# Patient Record
Sex: Female | Born: 1950 | Race: Black or African American | Hispanic: No | Marital: Married | Smoking: Never smoker
Health system: Southern US, Community
[De-identification: ages and names within clinical notes are randomized; demographics above are authoritative.]

## PROBLEM LIST (undated history)

## (undated) DIAGNOSIS — N289 Disorder of kidney and ureter, unspecified: Secondary | ICD-10-CM

## (undated) DIAGNOSIS — I10 Essential (primary) hypertension: Secondary | ICD-10-CM

## (undated) DIAGNOSIS — I509 Heart failure, unspecified: Secondary | ICD-10-CM

## (undated) DIAGNOSIS — I517 Cardiomegaly: Secondary | ICD-10-CM

## (undated) DIAGNOSIS — C801 Malignant (primary) neoplasm, unspecified: Secondary | ICD-10-CM

## (undated) HISTORY — PX: OTHER SURGICAL HISTORY: SHX169

---

## 2014-10-23 ENCOUNTER — Inpatient Hospital Stay (HOSPITAL_BASED_OUTPATIENT_CLINIC_OR_DEPARTMENT_OTHER)
Admission: EM | Admit: 2014-10-23 | Discharge: 2014-10-27 | DRG: 208 | Disposition: A | Discharge status: Home / Self Care | Payer: BC Managed Care – PPO | Attending: Emergency Medicine | Admitting: Emergency Medicine

## 2014-10-23 ENCOUNTER — Encounter (HOSPITAL_BASED_OUTPATIENT_CLINIC_OR_DEPARTMENT_OTHER): Payer: Self-pay

## 2014-10-23 ENCOUNTER — Emergency Department (HOSPITAL_BASED_OUTPATIENT_CLINIC_OR_DEPARTMENT_OTHER): Payer: BC Managed Care – PPO

## 2014-10-23 DIAGNOSIS — J9601 Acute respiratory failure with hypoxia: Secondary | ICD-10-CM | POA: Diagnosis present

## 2014-10-23 DIAGNOSIS — I120 Hypertensive chronic kidney disease with stage 5 chronic kidney disease or end stage renal disease: Secondary | ICD-10-CM | POA: Diagnosis present

## 2014-10-23 DIAGNOSIS — I509 Heart failure, unspecified: Secondary | ICD-10-CM | POA: Diagnosis present

## 2014-10-23 DIAGNOSIS — R0602 Shortness of breath: Secondary | ICD-10-CM | POA: Diagnosis not present

## 2014-10-23 DIAGNOSIS — J810 Acute pulmonary edema: Principal | ICD-10-CM

## 2014-10-23 DIAGNOSIS — Z923 Personal history of irradiation: Secondary | ICD-10-CM

## 2014-10-23 DIAGNOSIS — D638 Anemia in other chronic diseases classified elsewhere: Secondary | ICD-10-CM | POA: Diagnosis present

## 2014-10-23 DIAGNOSIS — Z9221 Personal history of antineoplastic chemotherapy: Secondary | ICD-10-CM

## 2014-10-23 DIAGNOSIS — N186 End stage renal disease: Secondary | ICD-10-CM | POA: Diagnosis present

## 2014-10-23 DIAGNOSIS — Z992 Dependence on renal dialysis: Secondary | ICD-10-CM

## 2014-10-23 DIAGNOSIS — I248 Other forms of acute ischemic heart disease: Secondary | ICD-10-CM | POA: Diagnosis present

## 2014-10-23 DIAGNOSIS — Z853 Personal history of malignant neoplasm of breast: Secondary | ICD-10-CM

## 2014-10-23 DIAGNOSIS — Z6841 Body Mass Index (BMI) 40.0 and over, adult: Secondary | ICD-10-CM

## 2014-10-23 DIAGNOSIS — E611 Iron deficiency: Secondary | ICD-10-CM | POA: Diagnosis present

## 2014-10-23 DIAGNOSIS — J9600 Acute respiratory failure, unspecified whether with hypoxia or hypercapnia: Secondary | ICD-10-CM

## 2014-10-23 HISTORY — DX: Essential (primary) hypertension: I10

## 2014-10-23 HISTORY — DX: Cardiomegaly: I51.7

## 2014-10-23 HISTORY — DX: Disorder of kidney and ureter, unspecified: N28.9

## 2014-10-23 HISTORY — DX: Heart failure, unspecified: I50.9

## 2014-10-23 HISTORY — DX: Malignant (primary) neoplasm, unspecified: C80.1

## 2014-10-23 LAB — CBC WITH DIFFERENTIAL/PLATELET
BASOS PCT: 0 % (ref 0–1)
Basophils Absolute: 0 10*3/uL (ref 0.0–0.1)
EOS ABS: 1.3 10*3/uL — AB (ref 0.0–0.7)
Eosinophils Relative: 6 % — ABNORMAL HIGH (ref 0–5)
HCT: 39.6 % (ref 36.0–46.0)
HEMOGLOBIN: 12.5 g/dL (ref 12.0–15.0)
Lymphocytes Relative: 7 % — ABNORMAL LOW (ref 12–46)
Lymphs Abs: 1.5 10*3/uL (ref 0.7–4.0)
MCH: 32.1 pg (ref 26.0–34.0)
MCHC: 31.6 g/dL (ref 30.0–36.0)
MCV: 101.8 fL — ABNORMAL HIGH (ref 78.0–100.0)
Monocytes Absolute: 0.7 10*3/uL (ref 0.1–1.0)
Monocytes Relative: 4 % (ref 3–12)
Neutro Abs: 17 10*3/uL — ABNORMAL HIGH (ref 1.7–7.7)
Neutrophils Relative %: 82 % — ABNORMAL HIGH (ref 43–77)
Platelets: 224 10*3/uL (ref 150–400)
RBC: 3.89 MIL/uL (ref 3.87–5.11)
RDW: 17.5 % — ABNORMAL HIGH (ref 11.5–15.5)
WBC: 20.6 10*3/uL — ABNORMAL HIGH (ref 4.0–10.5)

## 2014-10-23 LAB — URINE MICROSCOPIC-ADD ON

## 2014-10-23 LAB — I-STAT ARTERIAL BLOOD GAS, ED
ACID-BASE EXCESS: 3 mmol/L — AB (ref 0.0–2.0)
Acid-base deficit: 1 mmol/L (ref 0.0–2.0)
BICARBONATE: 28.7 meq/L — AB (ref 20.0–24.0)
Bicarbonate: 26.3 mEq/L — ABNORMAL HIGH (ref 20.0–24.0)
O2 SAT: 99 %
O2 Saturation: 97 %
PCO2 ART: 53.8 mmHg — AB (ref 35.0–45.0)
Patient temperature: 98.6
TCO2: 28 mmol/L (ref 0–100)
TCO2: 30 mmol/L (ref 0–100)
pCO2 arterial: 46 mmHg — ABNORMAL HIGH (ref 35.0–45.0)
pH, Arterial: 7.298 — ABNORMAL LOW (ref 7.350–7.450)
pH, Arterial: 7.403 (ref 7.350–7.450)
pO2, Arterial: 102 mmHg — ABNORMAL HIGH (ref 80.0–100.0)
pO2, Arterial: 143 mmHg — ABNORMAL HIGH (ref 80.0–100.0)

## 2014-10-23 LAB — COMPREHENSIVE METABOLIC PANEL
ALK PHOS: 113 U/L (ref 38–126)
ALT: 63 U/L — AB (ref 14–54)
AST: 84 U/L — AB (ref 15–41)
Albumin: 3.9 g/dL (ref 3.5–5.0)
Anion gap: 17 — ABNORMAL HIGH (ref 5–15)
BUN: 32 mg/dL — ABNORMAL HIGH (ref 6–20)
CALCIUM: 9 mg/dL (ref 8.9–10.3)
CO2: 24 mmol/L (ref 22–32)
Chloride: 102 mmol/L (ref 101–111)
Creatinine, Ser: 7.38 mg/dL — ABNORMAL HIGH (ref 0.44–1.00)
GFR calc Af Amer: 6 mL/min — ABNORMAL LOW (ref >60)
GFR, EST NON AFRICAN AMERICAN: 5 mL/min — AB (ref >60)
Glucose, Bld: 180 mg/dL — ABNORMAL HIGH (ref 65–99)
POTASSIUM: 3.8 mmol/L (ref 3.5–5.1)
Sodium: 143 mmol/L (ref 135–145)
Total Bilirubin: 0.6 mg/dL (ref 0.3–1.2)
Total Protein: 6.9 g/dL (ref 6.5–8.1)

## 2014-10-23 LAB — URINALYSIS, ROUTINE W REFLEX MICROSCOPIC
Bilirubin Urine: NEGATIVE
GLUCOSE, UA: 100 mg/dL — AB
Ketones, ur: NEGATIVE mg/dL
Leukocytes, UA: NEGATIVE
Nitrite: NEGATIVE
Protein, ur: >300 mg/dL — AB
Specific Gravity, Urine: 1.01 (ref 1.005–1.030)
Urobilinogen, UA: 0.2 mg/dL (ref 0.0–1.0)
pH: 8 (ref 5.0–8.0)

## 2014-10-23 LAB — PROTIME-INR
INR: 1.03 (ref 0.00–1.49)
Prothrombin Time: 13.7 seconds (ref 11.6–15.2)

## 2014-10-23 LAB — TROPONIN I
TROPONIN I: 0.09 ng/mL — AB (ref <0.031)
Troponin I: 0.1 ng/mL — ABNORMAL HIGH (ref <0.031)

## 2014-10-23 LAB — I-STAT CG4 LACTIC ACID, ED: LACTIC ACID, VENOUS: 2.66 mmol/L — AB (ref 0.5–2.0)

## 2014-10-23 MED ORDER — LIDOCAINE HCL (CARDIAC) 20 MG/ML IV SOLN
INTRAVENOUS | Status: AC
  Filled 2014-10-23: qty 5

## 2014-10-23 MED ORDER — PROPOFOL 10 MG/ML IV BOLUS
0.5000 mg/kg | Freq: Once | INTRAVENOUS | Status: DC
  Administered 2014-10-23: 1000 mg via INTRAVENOUS

## 2014-10-23 MED ORDER — FENTANYL CITRATE (PF) 100 MCG/2ML IJ SOLN: 50.0000 ug | Freq: Once | INTRAMUSCULAR | Status: AC

## 2014-10-23 MED ORDER — FENTANYL CITRATE (PF) 100 MCG/2ML IJ SOLN
INTRAMUSCULAR | Status: AC
  Administered 2014-10-23: 50 ug
  Filled 2014-10-23: qty 2

## 2014-10-23 MED ORDER — ETOMIDATE 2 MG/ML IV SOLN
INTRAVENOUS | Status: AC | PRN
  Administered 2014-10-23: 20 mg via INTRAVENOUS

## 2014-10-23 MED ORDER — ASPIRIN 81 MG PO CHEW: 324.0000 mg | CHEWABLE_TABLET | Freq: Once | ORAL | Status: DC

## 2014-10-23 MED ORDER — PROPOFOL 1000 MG/100ML IV EMUL
INTRAVENOUS | Status: AC | PRN
  Administered 2014-10-23: 5 ug/kg/min via INTRAVENOUS

## 2014-10-23 MED ORDER — SODIUM CHLORIDE 0.9 % IV SOLN
20.0000 ug/h | INTRAVENOUS | Status: DC
  Filled 2014-10-23: qty 50

## 2014-10-23 MED ORDER — NITROGLYCERIN IN D5W 200-5 MCG/ML-% IV SOLN
0.0000 ug/min | Freq: Once | INTRAVENOUS | Status: AC
  Administered 2014-10-23: 10 ug/min via INTRAVENOUS

## 2014-10-23 MED ORDER — ONDANSETRON HCL 4 MG/2ML IJ SOLN
4.0000 mg | Freq: Once | INTRAMUSCULAR | Status: AC
  Administered 2014-10-23: 4 mg via INTRAVENOUS

## 2014-10-23 MED ORDER — ONDANSETRON HCL 4 MG/2ML IJ SOLN
INTRAMUSCULAR | Status: AC
  Filled 2014-10-23: qty 2

## 2014-10-23 MED ORDER — VANCOMYCIN HCL IN DEXTROSE 1-5 GM/200ML-% IV SOLN
1000.0000 mg | Freq: Once | INTRAVENOUS | Status: AC
Start: 2014-10-23 — End: 2014-10-23
  Administered 2014-10-23: 1000 mg via INTRAVENOUS
  Filled 2014-10-23: qty 200

## 2014-10-23 MED ORDER — ETOMIDATE 2 MG/ML IV SOLN
INTRAVENOUS | Status: AC
  Filled 2014-10-23: qty 20

## 2014-10-23 MED ORDER — ROCURONIUM BROMIDE 50 MG/5ML IV SOLN
INTRAVENOUS | Status: AC | PRN
  Administered 2014-10-23: 100 mg via INTRAVENOUS

## 2014-10-23 MED ORDER — FUROSEMIDE 10 MG/ML IJ SOLN
80.0000 mg | Freq: Once | INTRAMUSCULAR | Status: AC
  Administered 2014-10-23: 80 mg via INTRAVENOUS
  Filled 2014-10-23: qty 8

## 2014-10-23 MED ORDER — SUCCINYLCHOLINE CHLORIDE 20 MG/ML IJ SOLN
INTRAMUSCULAR | Status: AC
  Filled 2014-10-23: qty 1

## 2014-10-23 MED ORDER — NITROGLYCERIN IN D5W 200-5 MCG/ML-% IV SOLN
INTRAVENOUS | Status: AC
  Filled 2014-10-23: qty 250

## 2014-10-23 MED ORDER — PROPOFOL 1000 MG/100ML IV EMUL
INTRAVENOUS | Status: AC
  Filled 2014-10-23: qty 100

## 2014-10-23 MED ORDER — ROCURONIUM BROMIDE 50 MG/5ML IV SOLN
INTRAVENOUS | Status: AC
  Filled 2014-10-23: qty 2

## 2014-10-23 MED ORDER — FENTANYL CITRATE (PF) 100 MCG/2ML IJ SOLN
50.0000 ug | Freq: Once | INTRAMUSCULAR | Status: AC
  Administered 2014-10-23: 50 ug via INTRAVENOUS
  Filled 2014-10-23: qty 2

## 2014-10-23 MED ORDER — PIPERACILLIN-TAZOBACTAM 3.375 G IVPB
3.3750 g | Freq: Once | INTRAVENOUS | Status: AC
  Administered 2014-10-23: 3.375 g via INTRAVENOUS
  Filled 2014-10-23: qty 50

## 2014-10-23 MED ORDER — FUROSEMIDE 10 MG/ML IJ SOLN
INTRAMUSCULAR | Status: AC | PRN
  Administered 2014-10-23: 80 mg via INTRAVENOUS

## 2014-10-23 MED ORDER — FUROSEMIDE 10 MG/ML IJ SOLN
INTRAMUSCULAR | Status: AC
  Filled 2014-10-23: qty 8

## 2014-10-23 MED ORDER — PROPOFOL 1000 MG/100ML IV EMUL
5.0000 ug/kg/min | Freq: Once | INTRAVENOUS | Status: AC
Start: 2014-10-23 — End: 2014-10-23
  Administered 2014-10-23: 40 ug/kg/min via INTRAVENOUS

## 2014-10-23 MED ORDER — LABETALOL HCL 5 MG/ML IV SOLN: 20.0000 mg | Freq: Once | INTRAVENOUS | Status: DC

## 2014-10-23 NOTE — H&P (Addendum)
PULMONARY / CRITICAL CARE MEDICINE   Name: Courtney Bullock MRN: [id number] DOB: [birth year]    ADMISSION DATE:  10/23/2014  CHIEF COMPLAINT:  Shortness of breath  HISTORY OF PRESENT ILLNESS:  Patient is a 64 year old female with a past medical history significant for end-stage renal disease on hemodialysis (she does make urine, though), CHF unknown type, breast cancer cancer. Per report, the patient's husband at the outside ED states that the patient has not missed any dialysis appointments. Her last session was 1 day ago.  Per report, patient was picked up by EMS where she was wearing her home CPAP and was placed on BiPAP at the emergency department but then was noted to have a decreased level of consciousness and then became nonverbal. She began to arouse after about 10 minutes at the emergency department but then became nauseous which led to her being taken off of BiPAP. Her work of breathing increased and the decision was made to intubate the patient. It is noted in the respiratory therapist note that she had a moderate amount of pink, thick, frothy secretions which were suctioned from the endotracheal tube.  Upon arrival to our ICU the patient's husband and daughter were at bedside and they agreed with the history provided from the other facility stating that she was in her normal state of health and they were getting ready to go to dinner when the patient had an acute onset of shortness of breath upon standing from the couch. Family states that she was at her baseline state of health prior to this  PAST MEDICAL HISTORY :   has a past medical history of Renal disorder; Hypertension; Cancer; CHF (congestive heart failure); and Cardiomegaly.  has past surgical history that includes uta. Prior to Admission medications   Not on File   Allergies not on file  FAMILY HISTORY:  has no family status information on file.  SOCIAL HISTORY:    REVIEW OF SYSTEMS:  Unable to obtain as the patient is  currently sedated on propofol and intubated   VITAL SIGNS: Temp:  [97.9 F (36.6 C)-98.2 F (36.8 C)] 98.1 F (36.7 C) (06/18 2315) Pulse Rate:  [81-131] 81 (06/18 2315) Resp:  [0-41] 32 (06/18 2315) BP: (98-262)/(63-147) 98/63 mmHg (06/18 2315) SpO2:  [85 %-100 %] 100 % (06/18 2315) FiO2 (%):  [80 %-100 %] 80 % (06/18 2144) Weight:  [242 lb 8.1 oz (110 kg)] 242 lb 8.1 oz (110 kg) (06/18 2050) HEMODYNAMICS:   VENTILATOR SETTINGS: Vent Mode:  [-] PRVC FiO2 (%):  [80 %-100 %] 80 % Set Rate:  [15 bmp-32 bmp] 32 bmp Vt Set:  [400 mL-550 mL] 400 mL PEEP:  [5 cmH20-10 cmH20] 10 cmH20 Plateau Pressure:  [28 cmH20] 28 cmH20 INTAKE / OUTPUT:  Intake/Output Summary (Last 24 hours) at 10/23/14 2358 Last data filed at 10/23/14 2215  Gross per 24 hour  Intake 298.06 ml  Output     50 ml  Net 248.06 ml    PHYSICAL EXAMINATION: General:  64 year old African-American female who appears her appear stated age in no acute distress. She is currently sedated and intubated. She appears well-nourished and well-developed Neuro:  Cranial nerves II-XII grossly intact. PERRL. her pupils are 2 mm. EOMI was not assessed. Sensation and strength was not assessed.  HEENT: Head, normocephalic, atraumatic. Ears symmetric, permeable. Eyes: no conjunctival icterus, no erythema. Nose: permeable, midline septum no NGT present. Clear oropharynx ETT in place, fair dentition. Cardiovascular: S1S2 RRR no murmurs, rubs, or gallops  auscultated. No thrills palpated. She has a dialysis catheter in her right upper chest and the scar of her port in her left upper chest. She does have 1+ pitting edema in bilateral lower extremities. Lungs:  Chest symmetrical with respirations, clear to auscultation bilaterally with no wheezing, some crackles, equal expansion.  Abdomen:  Soft, nontender, no guarding, nondistended. Present bowel sounds. Musculoskeletal:  No muscle atrophy noted nor weakness. ROM was not assessed. Gait was not  assessed due to critical illness. No swelling nor tenderness of the joints.  Skin:  No notable scars, rashes, cruises. No bed sores noted.  Lymphatics: no palpable lymphadenopathy of cervical, supraclvicular nor inguinal areas.    LABS:  CBC  Recent Labs Lab 10/23/14 2000 10/24/14 0225  WBC 20.6* 10.8*  HGB 12.5 11.2*  HCT 39.6 35.3*  PLT 224 192   Coag's  Recent Labs Lab 10/23/14 2000  INR 1.03   BMET  Recent Labs Lab 10/23/14 2000 10/24/14 0225  NA 143 142  K 3.8 4.8  CL 102 104  CO2 24 25  BUN 32* 31*  CREATININE 7.38* 7.94*  GLUCOSE 180* 87   Electrolytes  Recent Labs Lab 10/23/14 2000 10/24/14 0225  CALCIUM 9.0 9.2  MG  --  2.3  PHOS  --  7.1*   Sepsis Markers  Recent Labs Lab 10/23/14 2103  LATICACIDVEN 2.66*   ABG  Recent Labs Lab 10/23/14 2027 10/23/14 2142 10/24/14 0145  PHART 7.298* 7.403 7.480*  PCO2ART 53.8* 46.0* 33.4*  PO2ART 102.0* 143.0* 218*   Liver Enzymes  Recent Labs Lab 10/23/14 2000 10/24/14 0225  AST 84* 52*  ALT 63* 48  ALKPHOS 113 87  BILITOT 0.6 0.7  ALBUMIN 3.9 3.2*   Cardiac Enzymes  Recent Labs Lab 10/23/14 2000 10/23/14 2210 10/24/14 0225  TROPONINI 0.09* 0.10* 0.12*   Glucose No results for input(s): GLUCAP in the last 168 hours.  Imaging Dg Chest Portable 1 View  10/23/2014   CLINICAL DATA:  Hypoxia.  Chronic renal failure  EXAM: PORTABLE CHEST - 1 VIEW  COMPARISON:  None.  FINDINGS: Endotracheal tube tip is 4.2 cm above the carina. Central catheter tip is in the superior vena cava. No pneumothorax. There is extensive interstitial and alveolar edema diffusely. Heart is enlarged with pulmonary venous hypertension. No apparent adenopathy.  IMPRESSION: Tube and catheter positions as described without pneumothorax. Evidence of congestive heart failure with extensive edema as well as cardiomegaly.   Electronically Signed   By: Lowella Grip III M.D.   On: 10/23/2014 20:29     ASSESSMENT /  PLAN:  PULMONARY OETT placed on 10/23/2014 A: Acute hypoxemic respiratory failure secondary to pulmonary edema - Pink frothy secretions noted by RT at outside hospital P:  Vent management, will wean as tolerated but ultimately the patient will likely improve on the ventilator after hemodialysis in the morning  CARDIOVASCULAR A: Hypertensive emergency with end organ damage being flash pulmonary edema History of heart failure unknown type, acute exacerbation of this Elevated troponin of 0.12, likely secondary to hypertensive emergency P: check a 2-D echo, trend troponins, check EKG, obtain home medication regimen, attempt to wean nitroglycerin drip, start hydralazine IV as needed, will continue to try to dietaries  RENAL A:  End-stage renal disease on hemodialysis Tuesday Thursday Saturday, patient does not have any electrolyte abnormalities warranting emergent dialysis at this time -UA with significant proteinuria P:  consult nephrology in the morning for hemodialysis  GASTROINTESTINAL A:  Slight transaminitis with increased AST  of 52 possibly secondary to congestive hepatopathy, improved P:  GI prophylaxis with once a day famotidine due to her renal dysfunction  HEMATOLOGIC/oncologic A:  History of breast cancer now in remission for the last 2 years Normocytic anemia, likely of chronic disease P:   INFECTIOUS A:  Patient does not appear to have a source of infection at this time. She did receive broad-spectrum antibiotics empirically in the emergency department of outside hospital. The leukocytosis noted at outside hospitals now resolved P:  Follow up blood cultures taken at outside hospital  ENDOCRINE A:  No active issues   P:     NEUROLOGIC A:  No active issues, currently sedated on propofol P:  RASS goal: -1 to 0   FAMILY  - Updates: Daughter and husband at bedside. I answered all her questions and explained the differential.  - Inter-disciplinary family meet or  Palliative Care meeting due by:  day 7  Critical care time 76 minutes  Randa Lynn, MD [place] Pager: [phone]  10/24/2014, 4:15 AM

## 2014-10-23 NOTE — ED Notes (Signed)
Report given to Janalee Dane RN at bedside.

## 2014-10-23 NOTE — Code Documentation (Signed)
Family at bedside, EDP at bedside, family updated and made aware of patients condition and plan by EDP.

## 2014-10-23 NOTE — ED Notes (Signed)
Report given to [facility]. 

## 2014-10-23 NOTE — ED Provider Notes (Signed)
CSN: [id number]     Arrival date & time 10/23/14  1953 History   This chart was scribed for Courtney Essex, MD by Meriel Pica, ED Scribe. This patient was seen in room MHT14/MHT14 and the patient's care was started 7:56 PM.  Chief Complaint  Patient presents with  . Respiratory Distress   LEVEL 5 CAVEAT: ACUITY OF PATIENT'S CONDITION   The history is provided by the patient, the spouse and the EMS personnel.    HPI Comments: Courtney Bullock is a 64 y.o. female, with a PMhx of renal failure, cancer, and cardiac pathologies, who presents to the Emergency Department via EMS complaining of progressively worsening SOB. Pt on EMS BiPAP with decreased LOC. Non verbal on arrival. Pulses present but weak. Per EMS she was initially verbal on transport, she is now non verbal with AMS. Pt is out of town, residing in [place], and visiting at her daughter's residence. She is currently undergoing dialysis treatment. Husband  states she has not missed any dialysis appointments, with her last treatment being 1 day ago. Husband reports pt has a history of cancer that is in remission and cardiac pathologies but he denies cardiac stents. He also notes she has experienced  CP before but never this severe. He denies her complaining of CP today. Pt is now verbalizing SOB but states the O2 mask is helping her to breathe. She denies chest pain and abdominal pain. Husband states she produces a normal amount of urine and denies asthma, COPD, tobacco abuse, anuria, or pr taking any blood thinning medication or diuretics.   Past Medical History  Diagnosis Date  . Renal disorder   . Hypertension   . Cancer   . CHF (congestive heart failure)   . Cardiomegaly    Past Surgical History  Procedure Laterality Date  . Uta      pt intubated upon ed arrival   History reviewed. No pertinent family history. History  Substance Use Topics  . Smoking status: Unknown If Ever Smoked  . Smokeless tobacco: Not on file  .  Alcohol Use: Not on file   OB History    No data available     Review of Systems  Unable to perform ROS: Acuity of condition  Respiratory: Positive for shortness of breath.   Cardiovascular: Negative for chest pain.  Gastrointestinal: Negative for abdominal pain.   Allergies  Sulfamethoxazole and Trimethoprim  Home Medications   Prior to Admission medications   Not on File   BP 98/63 mmHg  Pulse 81  Temp(Src) 98.1 F (36.7 C)  Resp 32  Ht 5\' 2"  (1.575 m)  Wt 242 lb 8.1 oz (110 kg)  BMI 44.34 kg/m2  SpO2 100%   Physical Exam  Constitutional: She appears well-developed and well-nourished. She appears distressed.  +1 pitting edema bilaterally.   HENT:  Head: Normocephalic and atraumatic.  Mouth/Throat: Oropharynx is clear and moist. No oropharyngeal exudate.  Eyes: Conjunctivae and EOM are normal. Pupils are equal, round, and reactive to light.  Neck: Normal range of motion. Neck supple.  No meningismus.  Cardiovascular: Regular rhythm, normal heart sounds and intact distal pulses.   No murmur heard. Tachycardic, hypertensive.   Pulmonary/Chest: She is in respiratory distress. She has no wheezes. She has rales.  Obtunded, tachypnic, diffuse rales on lung exam.  Dialysis catheter in right upper chest.  Abdominal: Soft. Bowel sounds are normal. There is no tenderness. There is no rebound and no guarding.  Musculoskeletal: Normal range of motion. She  exhibits edema. She exhibits no tenderness.  Neurological: She is alert. No cranial nerve deficit. She exhibits normal muscle tone. Coordination normal.  Obtunded, not initially following commands.  Answering questions appropriately and moving all extremities to command after bipap for several minutes.  Skin: Skin is warm. She is diaphoretic.  Psychiatric: She has a normal mood and affect. Her behavior is normal.  Nursing note and vitals reviewed.   ED Course  INTUBATION Date/Time: 10/23/2014 9:34 PM Performed by:  Courtney Bullock Authorized by: Courtney Bullock Consent: The procedure was performed in an emergent situation. Verbal consent obtained. Risks and benefits: risks, benefits and alternatives were discussed Consent given by: spouse and patient Patient understanding: patient states understanding of the procedure being performed Patient identity confirmed: provided demographic data Time out: Immediately prior to procedure a "time out" was called to verify the correct patient, procedure, equipment, support staff and site/side marked as required. Indications: respiratory distress,  respiratory failure and  airway protection Intubation method: video-assisted Patient status: paralyzed (RSI) Preoxygenation: nonrebreather mask Sedatives: etomidate Paralytic: rocuronium Laryngoscope size: Mac 3 Tube size: 7.5 mm Tube type: cuffed Number of attempts: 1 Ventilation between attempts: BVM Cricoid pressure: yes Cords visualized: yes Post-procedure assessment: chest rise and ETCO2 monitor Breath sounds: equal Cuff inflated: yes ETT to lip: 22 cm Tube secured with: ETT holder Chest x-ray interpreted by me and radiologist. Chest x-ray findings: endotracheal tube in appropriate position Patient tolerance: Patient tolerated the procedure well with no immediate complications    DIAGNOSTIC STUDIES: Oxygen Saturation is 100% on O2 mask, normal by my interpretation.    COORDINATION OF CARE: 8:06 PM Discussed treatment plan which includes to order lasix and NTG drip.  Husband acknowledges and agrees to plan.  8:07 PM Pt experiencing an episode of emesis. Will perform intubation. Husband acknowledges and agrees to plan. Critical care transport called to [facility] ED.  Labs Review Labs Reviewed  CBC WITH DIFFERENTIAL/PLATELET - Abnormal; Notable for the following:    WBC 20.6 (*)    MCV 101.8 (*)    RDW 17.5 (*)    Neutrophils Relative % 82 (*)    Neutro Abs 17.0 (*)     Lymphocytes Relative 7 (*)    Eosinophils Relative 6 (*)    Eosinophils Absolute 1.3 (*)    All other components within normal limits  COMPREHENSIVE METABOLIC PANEL - Abnormal; Notable for the following:    Glucose, Bld 180 (*)    BUN 32 (*)    Creatinine, Ser 7.38 (*)    AST 84 (*)    ALT 63 (*)    GFR calc non Af Amer 5 (*)    GFR calc Af Amer 6 (*)    Anion gap 17 (*)    All other components within normal limits  TROPONIN I - Abnormal; Notable for the following:    Troponin I 0.09 (*)    All other components within normal limits  URINALYSIS, ROUTINE W REFLEX MICROSCOPIC (NOT AT [facility]) - Abnormal; Notable for the following:    Glucose, UA 100 (*)    Hgb urine dipstick MODERATE (*)    Protein, ur >300 (*)    All other components within normal limits  TROPONIN I - Abnormal; Notable for the following:    Troponin I 0.10 (*)    All other components within normal limits  I-STAT CG4 LACTIC ACID, ED - Abnormal; Notable for the following:    Lactic Acid, Venous 2.66 (*)  All other components within normal limits  I-STAT ARTERIAL BLOOD GAS, ED - Abnormal; Notable for the following:    pH, Arterial 7.298 (*)    pCO2 arterial 53.8 (*)    pO2, Arterial 102.0 (*)    Bicarbonate 26.3 (*)    All other components within normal limits  I-STAT ARTERIAL BLOOD GAS, ED - Abnormal; Notable for the following:    pCO2 arterial 46.0 (*)    pO2, Arterial 143.0 (*)    Bicarbonate 28.7 (*)    Acid-Base Excess 3.0 (*)    All other components within normal limits  CULTURE, BLOOD (ROUTINE X 2)  CULTURE, BLOOD (ROUTINE X 2)  MRSA PCR SCREENING  PROTIME-INR  URINE MICROSCOPIC-ADD ON  BLOOD GAS, ARTERIAL  MAGNESIUM  PHOSPHORUS  PROCALCITONIN  CBC WITH DIFFERENTIAL/PLATELET  COMPREHENSIVE METABOLIC PANEL  TROPONIN I  TROPONIN I  TROPONIN I  BLOOD GAS, ARTERIAL  BLOOD GAS, ARTERIAL  TRIGLYCERIDES  I-STAT CG4 LACTIC ACID, ED    Imaging Review Dg Chest Portable 1 View  10/23/2014    CLINICAL DATA:  Hypoxia.  Chronic renal failure  EXAM: PORTABLE CHEST - 1 VIEW  COMPARISON:  None.  FINDINGS: Endotracheal tube tip is 4.2 cm above the carina. Central catheter tip is in the superior vena cava. No pneumothorax. There is extensive interstitial and alveolar edema diffusely. Heart is enlarged with pulmonary venous hypertension. No apparent adenopathy.  IMPRESSION: Tube and catheter positions as described without pneumothorax. Evidence of congestive heart failure with extensive edema as well as cardiomegaly.   Electronically Signed   By: Lowella Grip III M.D.   On: 10/23/2014 20:29     EKG Interpretation   Date/Time:  Saturday October 23 2014 21:34:00 EDT Ventricular Rate:  108 PR Interval:  140 QRS Duration: 86 QT Interval:  390 QTC Calculation: 522 R Axis:   10 Text Interpretation:  Sinus tachycardia with Premature atrial complexes  Possible Left atrial enlargement Left ventricular hypertrophy Cannot rule  out Septal infarct , age undetermined ST \\T \ T wave abnormality, consider  lateral ischemia Prolonged QT Abnormal ECG No significant change was found  Confirmed by Wyvonnia Dusky  MD, Mairany Bruno [phone]) on 10/23/2014 9:40:46 PM      MDM   Final diagnoses:  Acute respiratory failure with hypoxia  Acute pulmonary edema   Presents by EMS with respiratory distress, hypoxia, tachycardia and hypertensive. Dialysis patient with last treatment yesterday. Visiting from out of town. Patient denies any chest pain according to her husband. She is not missed any dialysis sessions.  On arrival patient is obtunded, hypoxic, tachypnea. She is placed on BiPAP with improvement in her mental status. She denies any pain. She feels like her breathing is better though she still to And feels like she can't get enough air. Concern for volume overload. She is given IV Lasix as she reportedly makes urine, IV nitroglycerin, chest x-ray, labs, ABG.  EKG shows T-wave inversions inferior laterally. No  comparison.  Patient began to feel nauseated and had episode of emesis in her mouth that was quickly suctioned. At this point decision was made to intubate patient for airway protection.  Patient intubated as above. Chest x-ray confirms pulmonary edema. Discussed with critical care Dr. Joya Gaskins and nephrology who will admit patient to [facility] Patient's potassium is within normal limits. Dr. Jonnie Finner of nephrology has concern for cardiac etiology of her pulmonary edema given its acute onset.  Her troponin is minimally elevated.  BP improving on NTG gtt. Afebrile but does  have leukocytosis and elevated lactate.  Will cover with antibiotics for any possible superimposed pneumonia.  Admission to [place] dw. Dr. Joya Gaskins.  Dr. Jonnie Finner to consult for possible need for emergent dialysis and hypertensive emergency.   CRITICAL CARE Performed by: Courtney Bullock Total critical care time: 60 Critical care time was exclusive of separately billable procedures and treating other patients. Critical care was necessary to treat or prevent imminent or life-threatening deterioration. Critical care was time spent personally by me on the following activities: development of treatment plan with patient and/or surrogate as well as nursing, discussions with consultants, evaluation of patient's response to treatment, examination of patient, obtaining history from patient or surrogate, ordering and performing treatments and interventions, ordering and review of laboratory studies, ordering and review of radiographic studies, pulse oximetry and re-evaluation of patient's condition.    Courtney Essex, MD 10/24/14 [phone]

## 2014-10-23 NOTE — Progress Notes (Signed)
Received pt from EMS. Pt was short of breath and wearing home CPAP on their arrival. Pt placed on EMS CPAP with sats in the 70%. Placed on BIPAP/PC on arrival with settings of 16/6 R15 100%. Pt was initially unresponsive but began to arouse after 10 minutes. She then became nauseous and was taken off BIPAP. Pt unable to maintain sats with increased WOB. We proceeded to intubate the pt with 7.5 ETT secured with BBS and color change. Initial vent settings were 550/20/+5/100%. First ABG was drawn post intubation on these vent settings. Pt then began to desat and vent settings were changed to 400/32/+10/100%. Sats improved and moderate amount of thick, pink, frothy secretions were suctioned from the ETT. The pt was measured at 5'2" and 400cc is her 8cc VT. Repeat ABG was drawn after these vent changes and O2 has been titrated appropriately. RT will continue to monitor.

## 2014-10-23 NOTE — ED Notes (Signed)
Report given to [employer]. At [facility] .

## 2014-10-23 NOTE — ED Notes (Signed)
Pt presents via [facility] with c/o respiratory distress - pt on EMS bipap with decreased LOC, non verbal on arrival, pulses present but weak at this time. EDP to bedside.

## 2014-10-24 ENCOUNTER — Encounter (HOSPITAL_COMMUNITY): Payer: Self-pay | Admitting: Certified Registered Nurse Anesthetist

## 2014-10-24 ENCOUNTER — Inpatient Hospital Stay (HOSPITAL_COMMUNITY): Payer: BC Managed Care – PPO

## 2014-10-24 DIAGNOSIS — J9601 Acute respiratory failure with hypoxia: Secondary | ICD-10-CM | POA: Diagnosis present

## 2014-10-24 DIAGNOSIS — Z853 Personal history of malignant neoplasm of breast: Secondary | ICD-10-CM | POA: Diagnosis not present

## 2014-10-24 DIAGNOSIS — I120 Hypertensive chronic kidney disease with stage 5 chronic kidney disease or end stage renal disease: Secondary | ICD-10-CM | POA: Diagnosis present

## 2014-10-24 DIAGNOSIS — D638 Anemia in other chronic diseases classified elsewhere: Secondary | ICD-10-CM | POA: Diagnosis present

## 2014-10-24 DIAGNOSIS — I10 Essential (primary) hypertension: Secondary | ICD-10-CM | POA: Diagnosis not present

## 2014-10-24 DIAGNOSIS — I509 Heart failure, unspecified: Secondary | ICD-10-CM | POA: Diagnosis not present

## 2014-10-24 DIAGNOSIS — R0602 Shortness of breath: Secondary | ICD-10-CM | POA: Diagnosis present

## 2014-10-24 DIAGNOSIS — Z9221 Personal history of antineoplastic chemotherapy: Secondary | ICD-10-CM | POA: Diagnosis not present

## 2014-10-24 DIAGNOSIS — J810 Acute pulmonary edema: Secondary | ICD-10-CM | POA: Diagnosis present

## 2014-10-24 DIAGNOSIS — Z923 Personal history of irradiation: Secondary | ICD-10-CM | POA: Diagnosis not present

## 2014-10-24 DIAGNOSIS — E611 Iron deficiency: Secondary | ICD-10-CM | POA: Diagnosis present

## 2014-10-24 DIAGNOSIS — I248 Other forms of acute ischemic heart disease: Secondary | ICD-10-CM | POA: Diagnosis present

## 2014-10-24 DIAGNOSIS — N186 End stage renal disease: Secondary | ICD-10-CM | POA: Diagnosis present

## 2014-10-24 DIAGNOSIS — Z992 Dependence on renal dialysis: Secondary | ICD-10-CM | POA: Diagnosis not present

## 2014-10-24 DIAGNOSIS — Z6841 Body Mass Index (BMI) 40.0 and over, adult: Secondary | ICD-10-CM | POA: Diagnosis not present

## 2014-10-24 LAB — CBC WITH DIFFERENTIAL/PLATELET
Basophils Absolute: 0.1 10*3/uL (ref 0.0–0.1)
Basophils Relative: 1 % (ref 0–1)
EOS ABS: 0.1 10*3/uL (ref 0.0–0.7)
EOS PCT: 1 % (ref 0–5)
HCT: 35.3 % — ABNORMAL LOW (ref 36.0–46.0)
HEMOGLOBIN: 11.2 g/dL — AB (ref 12.0–15.0)
LYMPHS PCT: 7 % — AB (ref 12–46)
Lymphs Abs: 0.7 10*3/uL (ref 0.7–4.0)
MCH: 31.3 pg (ref 26.0–34.0)
MCHC: 31.7 g/dL (ref 30.0–36.0)
MCV: 98.6 fL (ref 78.0–100.0)
Monocytes Absolute: 0.8 10*3/uL (ref 0.1–1.0)
Monocytes Relative: 8 % (ref 3–12)
NEUTROS PCT: 83 % — AB (ref 43–77)
Neutro Abs: 9.1 10*3/uL — ABNORMAL HIGH (ref 1.7–7.7)
Platelets: 192 10*3/uL (ref 150–400)
RBC: 3.58 MIL/uL — ABNORMAL LOW (ref 3.87–5.11)
RDW: 17.7 % — ABNORMAL HIGH (ref 11.5–15.5)
WBC: 10.8 10*3/uL — ABNORMAL HIGH (ref 4.0–10.5)

## 2014-10-24 LAB — BLOOD GAS, ARTERIAL
Acid-Base Excess: 1.4 mmol/L (ref 0.0–2.0)
Bicarbonate: 24.6 mEq/L — ABNORMAL HIGH (ref 20.0–24.0)
Drawn by: 437071
FIO2: 0.8 %
LHR: 24 {breaths}/min
MECHVT: 400 mL
O2 Saturation: 99.8 %
PEEP: 10 cmH2O
Patient temperature: 98.1
TCO2: 25.7 mmol/L (ref 0–100)
pCO2 arterial: 33.4 mmHg — ABNORMAL LOW (ref 35.0–45.0)
pH, Arterial: 7.48 — ABNORMAL HIGH (ref 7.350–7.450)
pO2, Arterial: 218 mmHg — ABNORMAL HIGH (ref 80.0–100.0)

## 2014-10-24 LAB — BRAIN NATRIURETIC PEPTIDE: B Natriuretic Peptide: 3355.7 pg/mL — ABNORMAL HIGH (ref 0.0–100.0)

## 2014-10-24 LAB — COMPREHENSIVE METABOLIC PANEL
ALT: 48 U/L (ref 14–54)
AST: 52 U/L — ABNORMAL HIGH (ref 15–41)
Albumin: 3.2 g/dL — ABNORMAL LOW (ref 3.5–5.0)
Alkaline Phosphatase: 87 U/L (ref 38–126)
Anion gap: 13 (ref 5–15)
BUN: 31 mg/dL — AB (ref 6–20)
CALCIUM: 9.2 mg/dL (ref 8.9–10.3)
CO2: 25 mmol/L (ref 22–32)
Chloride: 104 mmol/L (ref 101–111)
Creatinine, Ser: 7.94 mg/dL — ABNORMAL HIGH (ref 0.44–1.00)
GFR, EST AFRICAN AMERICAN: 6 mL/min — AB (ref >60)
GFR, EST NON AFRICAN AMERICAN: 5 mL/min — AB (ref >60)
GLUCOSE: 87 mg/dL (ref 65–99)
Potassium: 4.8 mmol/L (ref 3.5–5.1)
Sodium: 142 mmol/L (ref 135–145)
Total Bilirubin: 0.7 mg/dL (ref 0.3–1.2)
Total Protein: 5.9 g/dL — ABNORMAL LOW (ref 6.5–8.1)

## 2014-10-24 LAB — PHOSPHORUS: PHOSPHORUS: 7.1 mg/dL — AB (ref 2.5–4.6)

## 2014-10-24 LAB — PROCALCITONIN: Procalcitonin: 2.47 ng/mL

## 2014-10-24 LAB — IRON AND TIBC
Iron: 25 ug/dL — ABNORMAL LOW (ref 28–170)
Saturation Ratios: 13 % (ref 10.4–31.8)
TIBC: 188 ug/dL — AB (ref 250–450)
UIBC: 163 ug/dL

## 2014-10-24 LAB — TROPONIN I
Troponin I: 0.12 ng/mL — ABNORMAL HIGH (ref <0.031)
Troponin I: 0.14 ng/mL — ABNORMAL HIGH (ref <0.031)
Troponin I: 0.16 ng/mL — ABNORMAL HIGH (ref <0.031)

## 2014-10-24 LAB — FERRITIN: Ferritin: 618 ng/mL — ABNORMAL HIGH (ref 11–307)

## 2014-10-24 LAB — MAGNESIUM: Magnesium: 2.3 mg/dL (ref 1.7–2.4)

## 2014-10-24 LAB — TRIGLYCERIDES: Triglycerides: 43 mg/dL (ref <150)

## 2014-10-24 LAB — MRSA PCR SCREENING: MRSA by PCR: NEGATIVE

## 2014-10-24 MED ORDER — HYDRALAZINE HCL 20 MG/ML IJ SOLN
10.0000 mg | INTRAMUSCULAR | Status: DC | PRN
  Administered 2014-10-24: 40 mg via INTRAVENOUS
  Administered 2014-10-24: 20 mg via INTRAVENOUS
  Administered 2014-10-24 (×2): 40 mg via INTRAVENOUS
  Administered 2014-10-25 (×3): 20 mg via INTRAVENOUS
  Administered 2014-10-26: 10 mg via INTRAVENOUS
  Filled 2014-10-24: qty 2
  Filled 2014-10-24 (×3): qty 1
  Filled 2014-10-24 (×2): qty 2
  Filled 2014-10-24 (×2): qty 1

## 2014-10-24 MED ORDER — CETYLPYRIDINIUM CHLORIDE 0.05 % MT LIQD
7.0000 mL | Freq: Four times a day (QID) | OROMUCOSAL | Status: DC
  Administered 2014-10-24 – 2014-10-25 (×7): 7 mL via OROMUCOSAL

## 2014-10-24 MED ORDER — CHLORHEXIDINE GLUCONATE 0.12 % MT SOLN
15.0000 mL | Freq: Two times a day (BID) | OROMUCOSAL | Status: DC
  Administered 2014-10-24 – 2014-10-25 (×4): 15 mL via OROMUCOSAL
  Filled 2014-10-24 (×5): qty 15

## 2014-10-24 MED ORDER — NITROGLYCERIN IN D5W 200-5 MCG/ML-% IV SOLN
0.0000 ug/min | INTRAVENOUS | Status: DC
  Administered 2014-10-24: 100 ug/min via INTRAVENOUS
  Administered 2014-10-24: 40 ug/min via INTRAVENOUS
  Administered 2014-10-25 (×2): 60 ug/min via INTRAVENOUS
  Administered 2014-10-26: 30 ug/min via INTRAVENOUS
  Administered 2014-10-26: 58 ug/min via INTRAVENOUS
  Administered 2014-10-26: 60 ug/min via INTRAVENOUS
  Administered 2014-10-26: 40 ug/min via INTRAVENOUS
  Filled 2014-10-24 (×4): qty 250

## 2014-10-24 MED ORDER — METOPROLOL TARTRATE 100 MG PO TABS
100.0000 mg | ORAL_TABLET | Freq: Two times a day (BID) | ORAL | Status: DC
  Administered 2014-10-24 – 2014-10-27 (×6): 100 mg via ORAL
  Filled 2014-10-24 (×4): qty 1
  Filled 2014-10-24: qty 4
  Filled 2014-10-24 (×3): qty 1

## 2014-10-24 MED ORDER — HEPARIN SODIUM (PORCINE) 1000 UNIT/ML DIALYSIS
2000.0000 [IU] | INTRAMUSCULAR | Status: DC | PRN
  Filled 2014-10-24: qty 2

## 2014-10-24 MED ORDER — ACETAMINOPHEN 325 MG PO TABS
650.0000 mg | ORAL_TABLET | ORAL | Status: DC | PRN
  Administered 2014-10-24: 650 mg via ORAL
  Filled 2014-10-24: qty 2

## 2014-10-24 MED ORDER — FENTANYL CITRATE (PF) 100 MCG/2ML IJ SOLN
100.0000 ug | INTRAMUSCULAR | Status: DC | PRN
  Administered 2014-10-24: 100 ug via INTRAVENOUS

## 2014-10-24 MED ORDER — LABETALOL 40 MG/ML PEDIATRIC ORAL SUSPENSION
200.0000 mg | Freq: Two times a day (BID) | ORAL | Status: DC
  Filled 2014-10-24 (×2): qty 5

## 2014-10-24 MED ORDER — DOXAZOSIN MESYLATE 2 MG PO TABS
2.0000 mg | ORAL_TABLET | Freq: Every day | ORAL | Status: DC
  Administered 2014-10-24 – 2014-10-26 (×3): 2 mg via ORAL
  Filled 2014-10-24 (×4): qty 1

## 2014-10-24 MED ORDER — HYDRALAZINE HCL 50 MG PO TABS
50.0000 mg | ORAL_TABLET | Freq: Two times a day (BID) | ORAL | Status: DC
  Administered 2014-10-24 – 2014-10-25 (×3): 50 mg via ORAL
  Filled 2014-10-24 (×5): qty 1

## 2014-10-24 MED ORDER — DOCUSATE SODIUM 50 MG/5ML PO LIQD
100.0000 mg | Freq: Two times a day (BID) | ORAL | Status: DC | PRN
  Filled 2014-10-24: qty 10

## 2014-10-24 MED ORDER — LABETALOL HCL 5 MG/ML IV SOLN
10.0000 mg | INTRAVENOUS | Status: DC | PRN
  Administered 2014-10-24: 10 mg via INTRAVENOUS
  Administered 2014-10-24: 20 mg via INTRAVENOUS
  Filled 2014-10-24 (×3): qty 4

## 2014-10-24 MED ORDER — SODIUM CHLORIDE 0.9 % IV SOLN: 100.0000 mL | INTRAVENOUS | Status: DC | PRN

## 2014-10-24 MED ORDER — FUROSEMIDE 10 MG/ML IJ SOLN
80.0000 mg | Freq: Once | INTRAMUSCULAR | Status: AC
  Administered 2014-10-24: 80 mg via INTRAVENOUS
  Filled 2014-10-24: qty 8

## 2014-10-24 MED ORDER — AMLODIPINE BESYLATE 10 MG PO TABS
10.0000 mg | ORAL_TABLET | Freq: Every day | ORAL | Status: DC
  Administered 2014-10-25 – 2014-10-27 (×3): 10 mg via ORAL
  Filled 2014-10-24 (×3): qty 1

## 2014-10-24 MED ORDER — LIDOCAINE HCL (PF) 1 % IJ SOLN: 5.0000 mL | INTRAMUSCULAR | Status: DC | PRN

## 2014-10-24 MED ORDER — PROPOFOL 1000 MG/100ML IV EMUL
0.0000 ug/kg/min | INTRAVENOUS | Status: DC
  Administered 2014-10-24 (×2): 40 ug/kg/min via INTRAVENOUS
  Filled 2014-10-24 (×2): qty 100

## 2014-10-24 MED ORDER — CETYLPYRIDINIUM CHLORIDE 0.05 % MT LIQD: 7.0000 mL | Freq: Four times a day (QID) | OROMUCOSAL | Status: DC

## 2014-10-24 MED ORDER — CHLORHEXIDINE GLUCONATE 0.12 % MT SOLN
15.0000 mL | Freq: Two times a day (BID) | OROMUCOSAL | Status: DC
Start: 2014-10-24 — End: 2014-10-24

## 2014-10-24 MED ORDER — LIDOCAINE-PRILOCAINE 2.5-2.5 % EX CREA
1.0000 | TOPICAL_CREAM | CUTANEOUS | Status: DC | PRN
Start: 2014-10-24 — End: 2014-10-27

## 2014-10-24 MED ORDER — FAMOTIDINE IN NACL 20-0.9 MG/50ML-% IV SOLN
20.0000 mg | INTRAVENOUS | Status: DC
  Administered 2014-10-24 – 2014-10-26 (×3): 20 mg via INTRAVENOUS
  Filled 2014-10-24 (×4): qty 50

## 2014-10-24 MED ORDER — HEPARIN SODIUM (PORCINE) 1000 UNIT/ML DIALYSIS
1000.0000 [IU] | INTRAMUSCULAR | Status: DC | PRN
  Filled 2014-10-24: qty 1

## 2014-10-24 MED ORDER — HEPARIN SODIUM (PORCINE) 5000 UNIT/ML IJ SOLN
5000.0000 [IU] | Freq: Three times a day (TID) | INTRAMUSCULAR | Status: DC
  Administered 2014-10-24 – 2014-10-27 (×10): 5000 [IU] via SUBCUTANEOUS
  Filled 2014-10-24 (×13): qty 1

## 2014-10-24 MED ORDER — FENTANYL CITRATE (PF) 100 MCG/2ML IJ SOLN
100.0000 ug | INTRAMUSCULAR | Status: DC | PRN
  Filled 2014-10-24 (×3): qty 2

## 2014-10-24 MED ORDER — PENTAFLUOROPROP-TETRAFLUOROETH EX AERO: 1.0000 "application " | INHALATION_SPRAY | CUTANEOUS | Status: DC | PRN

## 2014-10-24 MED ORDER — CLONIDINE HCL 0.3 MG PO TABS
0.3000 mg | ORAL_TABLET | Freq: Every day | ORAL | Status: DC
  Administered 2014-10-24 – 2014-10-25 (×2): 0.3 mg via ORAL
  Filled 2014-10-24 (×3): qty 1

## 2014-10-24 MED ORDER — NEPRO/CARBSTEADY PO LIQD: 237.0000 mL | ORAL | Status: DC | PRN

## 2014-10-24 MED ORDER — HYDRALAZINE HCL 20 MG/ML IJ SOLN
10.0000 mg | INTRAMUSCULAR | Status: DC | PRN
Start: 2014-10-24 — End: 2014-10-24
  Administered 2014-10-24 (×4): 10 mg via INTRAVENOUS
  Filled 2014-10-24 (×4): qty 1

## 2014-10-24 MED ORDER — ONDANSETRON HCL 4 MG/2ML IJ SOLN: 4.0000 mg | Freq: Four times a day (QID) | INTRAMUSCULAR | Status: DC | PRN

## 2014-10-24 MED ORDER — SODIUM CHLORIDE 0.9 % IV SOLN: 250.0000 mL | INTRAVENOUS | Status: DC | PRN

## 2014-10-24 MED ORDER — ALTEPLASE 2 MG IJ SOLR: 2.0000 mg | Freq: Once | INTRAMUSCULAR | Status: AC | PRN

## 2014-10-24 NOTE — Progress Notes (Signed)
PULMONARY / CRITICAL CARE MEDICINE   Name: Courtney Bullock MRN: [id number] DOB: Sep 12, 1950    ADMISSION DATE:  10/23/2014  CHIEF COMPLAINT:  Shortness of breath  HISTORY OF PRESENT ILLNESS:  Patient is a 64 year old female with a past medical history significant for end-stage renal disease on hemodialysis (she does make urine, though), CHF unknown type, breast cancer cancer.Last HD 6/17 @ [facility] Intubated for acute pulmonary edema & hypertensive emergency  SUBJ  - awake, interactive, writing  Afebrile On PEEP 10    VITAL SIGNS: Temp:  [96.4 F (35.8 C)-98.2 F (36.8 C)] 97.7 F (36.5 C) (06/19 1030) Pulse Rate:  [72-131] 97 (06/19 1030) Resp:  [0-41] 15 (06/19 1030) BP: (98-262)/(63-147) 169/76 mmHg (06/19 1030) SpO2:  [85 %-100 %] 100 % (06/19 1030) FiO2 (%):  [50 %-100 %] 50 % (06/19 0812) Weight:  [82.5 kg (181 lb 14.1 oz)-110 kg (242 lb 8.1 oz)] 82.5 kg (181 lb 14.1 oz) (06/19 0442) HEMODYNAMICS:   VENTILATOR SETTINGS: Vent Mode:  [-] PRVC FiO2 (%):  [50 %-100 %] 50 % Set Rate:  [15 bmp-32 bmp] 24 bmp Vt Set:  [400 mL-550 mL] 400 mL PEEP:  [5 cmH20-10 cmH20] 10 cmH20 Plateau Pressure:  [21 cmH20-28 cmH20] 21 cmH20 INTAKE / OUTPUT:  Intake/Output Summary (Last 24 hours) at 10/24/14 1122 Last data filed at 10/24/14 1000  Gross per 24 hour  Intake 852.79 ml  Output    350 ml  Net 502.79 ml    PHYSICAL EXAMINATION: General:   appears her appear stated age in no acute distress. She is currently sedated and intubated. She appears well-nourished and well-developed Neuro:  Cranial nerves II-XII grossly intact. PERRL. her pupils are 2 mm. Interactive, non focal HEENT: Head, normocephalic, atraumatic. Ears symmetric, permeable. Eyes: no conjunctival icterus, no erythema. Nose: permeable, midline septum no NGT present. Clear oropharynx ETT in place, fair dentition. Cardiovascular: S1S2 RRR no murmurs, rubs, or gallops auscultated. No thrills palpated. She has a  dialysis catheter in her right upper chest and the scar of her port in her left upper chest. She does have 1+ pitting edema in bilateral lower extremities. Lungs:  Chest symmetrical with respirations, clear to auscultation bilaterally with no wheezing, some crackles, equal expansion.  Abdomen:  Soft, nontender, no guarding, nondistended. Present bowel sounds. Musculoskeletal:  No muscle atrophy noted nor weakness. ROM was not assessed. Gait was not assessed due to critical illness. No swelling nor tenderness of the joints.  Skin:  No notable scars, rashes, cruises. No bed sores noted.  Lymphatics: no palpable lymphadenopathy of cervical, supraclvicular nor inguinal areas.    LABS:  CBC  Recent Labs Lab 10/23/14 2000 10/24/14 0225  WBC 20.6* 10.8*  HGB 12.5 11.2*  HCT 39.6 35.3*  PLT 224 192   Coag's  Recent Labs Lab 10/23/14 2000  INR 1.03   BMET  Recent Labs Lab 10/23/14 2000 10/24/14 0225  NA 143 142  K 3.8 4.8  CL 102 104  CO2 24 25  BUN 32* 31*  CREATININE 7.38* 7.94*  GLUCOSE 180* 87   Electrolytes  Recent Labs Lab 10/23/14 2000 10/24/14 0225  CALCIUM 9.0 9.2  MG  --  2.3  PHOS  --  7.1*   Sepsis Markers  Recent Labs Lab 10/23/14 2103 10/24/14 0225  LATICACIDVEN 2.66*  --   PROCALCITON  --  2.47   ABG  Recent Labs Lab 10/23/14 2027 10/23/14 2142 10/24/14 0145  PHART 7.298* 7.403 7.480*  PCO2ART 53.8* 46.0*  33.4*  PO2ART 102.0* 143.0* 218*   Liver Enzymes  Recent Labs Lab 10/23/14 2000 10/24/14 0225  AST 84* 52*  ALT 63* 48  ALKPHOS 113 87  BILITOT 0.6 0.7  ALBUMIN 3.9 3.2*   Cardiac Enzymes  Recent Labs Lab 10/23/14 2000 10/23/14 2210 10/24/14 0225  TROPONINI 0.09* 0.10* 0.12*   Glucose No results for input(s): GLUCAP in the last 168 hours.  Imaging Dg Chest Port 1 View  10/24/2014   CLINICAL DATA:  Pulmonary edema  EXAM: PORTABLE CHEST - 1 VIEW  COMPARISON:  10/23/2014  FINDINGS: Endotracheal tube in good  position. Right jugular catheter tip in the SVC unchanged. No pneumothorax  Significant improvement in diffuse bilateral edema. Small right pleural effusion has developed since the prior study. Increase in bibasilar atelectasis  NG tube in the stomach  IMPRESSION: Endotracheal tube remains in good position.  Marked improvement in pulmonary edema. Mild edema and small right pleural effusion remain. Increase in bibasilar atelectasis.   Electronically Signed   By: Franchot Gallo M.D.   On: 10/24/2014 08:28   Dg Chest Portable 1 View  10/23/2014   CLINICAL DATA:  Hypoxia.  Chronic renal failure  EXAM: PORTABLE CHEST - 1 VIEW  COMPARISON:  None.  FINDINGS: Endotracheal tube tip is 4.2 cm above the carina. Central catheter tip is in the superior vena cava. No pneumothorax. There is extensive interstitial and alveolar edema diffusely. Heart is enlarged with pulmonary venous hypertension. No apparent adenopathy.  IMPRESSION: Tube and catheter positions as described without pneumothorax. Evidence of congestive heart failure with extensive edema as well as cardiomegaly.   Electronically Signed   By: Lowella Grip III M.D.   On: 10/23/2014 20:29     ASSESSMENT / PLAN:  PULMONARY OETT  10/23/2014 >> A: Acute hypoxemic respiratory failure secondary to acute  pulmonary edema  P:  SBTs with goal extubate after HD once BP better controlled Drop PEEP 10  CARDIOVASCULAR A: Hypertensive emergency with end organ damage being flash pulmonary edema History of heart failure unknown type, acute exacerbation of this Elevated troponin of 0.12, likely secondary to hypertensive emergency P: check a 2-D echo, trend troponins obtain home medication regimen, attempt to wean nitroglycerin drip, Use  Hydralazine & Labetalol IV prn  Trying toa void cardene gtt due to volume  RENAL A:  End-stage renal disease on hemodialysis M/W/F  patient does not have any electrolyte abnormalities warranting emergent dialysis at this  time -UA with significant proteinuria P:  consulted nephrology,  Hemodialysis planned  GASTROINTESTINAL A:  Slight transaminitis with increased AST of 52 possibly secondary to congestive hepatopathy, improved P:  GI prophylaxis with once a day famotidine   HEMATOLOGIC/oncologic A:  History of breast cancer now in remission for the last 2 years Normocytic anemia, likely of chronic disease H/o PICA -eating sand P: Check for fe def  INFECTIOUS A:  Patient does not appear to have a source of infection at this time. She did receive broad-spectrum antibiotics empirically in the emergency department of outside hospital. The leukocytosis noted at outside hospitals now resolved P:  Follow up blood cultures taken at outside hospital  ENDOCRINE A:  No active issues   P:     NEUROLOGIC A:  No active issues, currently sedated on propofol P:  RASS goal: -1 to 0   FAMILY  - Updates: Daughter and husband at bedside. I answered all her questions   - Inter-disciplinary family meet or Palliative Care meeting due by:  day  7  Summary -Acute pulm edema is resolving - htn urgency, doubt ACS, appears to be compliant with salt intake & HD  The patient is critically ill with multiple organ systems failure and requires high complexity decision making for assessment and support, frequent evaluation and titration of therapies, application of advanced monitoring technologies and extensive interpretation of multiple databases. Critical Care Time devoted to patient care services described in this note independent of APP time is 35 minutes.   Kara Mead MD. [place] Flood. [facility] Pager [phone] If no response call [phone]    10/24/2014, 11:22 AM

## 2014-10-24 NOTE — Progress Notes (Signed)
Received patient from [place] and placed on vent with no complications. RT will continue to monitor.

## 2014-10-24 NOTE — Consult Note (Signed)
Renal Service Consult Note [facility]  Courtney Bullock 10/24/2014 [place] Requesting Physician:  Dr Elsworth Soho  Reason for Consult:  ESRD pt with resp failure, acute HPI: The patient is a 64 y.o. year-old with hx of HTN, ESRD and CHF. Patient presented to outside ED by EMS for resp distress and AMS. At the home pt was wearing CPAP when picked up. On arrival was poorly responsive and placed on BiPAP. MS improved briefly then began to vomit so bipap had to be dc'd.  Resp status worsened requiring intubation. CXR initially showed bilat severe pulm edema. F/U CXR this am shows improvement in edema. Secretions were pink, thick and frothy from ETT.    History from husband and daughter is that she was doing well and they were getting ready for dinner and she had acute onset of SOB.  Does not miss HD and last HD was Friday.   Per pt's husband she started dialysis about 9-10 months ago, she gets dialysis in [place], [place] on MWF schedule. Husband doesn't know the cause of her ESRD.  Uses a catheter, doesn't have AVF , they have been "encouraging" her to get it done.      Past Medical History  Past Medical History  Diagnosis Date  . Renal disorder   . Hypertension   . Cancer   . CHF (congestive heart failure)   . Cardiomegaly    Past Surgical History  Past Surgical History  Procedure Laterality Date  . Uta      pt intubated upon ed arrival   Family History History reviewed. No pertinent family history. Social History  has no tobacco, alcohol, and drug history on file. Allergies  Allergies  Allergen Reactions  . Sulfamethoxazole Hives  . Trimethoprim Hives   Home medications Prior to Admission medications   Not on File   Liver Function Tests  Recent Labs Lab 10/23/14 2000 10/24/14 0225  AST 84* 52*  ALT 63* 48  ALKPHOS 113 87  BILITOT 0.6 0.7  PROT 6.9 5.9*  ALBUMIN 3.9 3.2*   No results for input(s): LIPASE, AMYLASE in the last 168  hours. CBC  Recent Labs Lab 10/23/14 2000 10/24/14 0225  WBC 20.6* 10.8*  NEUTROABS 17.0* 9.1*  HGB 12.5 11.2*  HCT 39.6 35.3*  MCV 101.8* 98.6  PLT 224 606   Basic Metabolic Panel  Recent Labs Lab 10/23/14 2000 10/24/14 0225  NA 143 142  K 3.8 4.8  CL 102 104  CO2 24 25  GLUCOSE 180* 87  BUN 32* 31*  CREATININE 7.38* 7.94*  CALCIUM 9.0 9.2  PHOS  --  7.1*    Filed Vitals:   10/24/14 0700 10/24/14 0723 10/24/14 0800 10/24/14 0812  BP: 153/76  166/67 166/67  Pulse: 84 87 86 87  Temp: 96.8 F (36 C) 97 F (36.1 C) 97.3 F (36.3 C) 97.3 F (36.3 C)  TempSrc:   Core (Comment)   Resp: 24 24 24 24   Height:      Weight:      SpO2: 100% 100% 100% 100%   Exam On vent, sedated not responding No rash, cyanosis or gangrene Sclera anicteric, throat clear +JVD Chest clear bilat ant and lateral RRR soft SEM 2/6 no RG Abd nondistended +BS no ascites GU foley in place 1+ pitting LE edema bilat No ulcers, wounds or lesions Neuro is sedated on vent  HD: MWF x 9 months via IJ cath R , no AVF yet   Assessment: 1. Acute  resp failure / flash pulm edema - in association with severe HTN crisis. 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  Regardless she needs HD.  BP is much better sedated and on IV NTG.   2. ESRD on HD in [place] MWF 9 mos, no AVF using cath 3. HTN 4. Hx cancer 5. Hx "CHF"   Plan- HD today, UF 4-5 kg as tolerated, wean IV NTG as tolerated, get records in am  Kelly Splinter MD (pgr) [phone]    ([phone] 10/24/2014, 8:57 AM

## 2014-10-24 NOTE — Progress Notes (Signed)
Utilization review completed.  

## 2014-10-24 NOTE — Progress Notes (Signed)
  Echocardiogram 2D Echocardiogram has been performed.  Courtney Bullock 10/24/2014, 9:45 AM

## 2014-10-25 ENCOUNTER — Inpatient Hospital Stay (HOSPITAL_COMMUNITY): Payer: BC Managed Care – PPO

## 2014-10-25 LAB — CBC
HCT: 30.8 % — ABNORMAL LOW (ref 36.0–46.0)
Hemoglobin: 9.9 g/dL — ABNORMAL LOW (ref 12.0–15.0)
MCH: 32 pg (ref 26.0–34.0)
MCHC: 32.1 g/dL (ref 30.0–36.0)
MCV: 99.7 fL (ref 78.0–100.0)
Platelets: 168 10*3/uL (ref 150–400)
RBC: 3.09 MIL/uL — ABNORMAL LOW (ref 3.87–5.11)
RDW: 18.8 % — AB (ref 11.5–15.5)
WBC: 9.8 10*3/uL (ref 4.0–10.5)

## 2014-10-25 LAB — BASIC METABOLIC PANEL
Anion gap: 13 (ref 5–15)
BUN: 18 mg/dL (ref 6–20)
CALCIUM: 8.8 mg/dL — AB (ref 8.9–10.3)
CO2: 24 mmol/L (ref 22–32)
Chloride: 101 mmol/L (ref 101–111)
Creatinine, Ser: 6.39 mg/dL — ABNORMAL HIGH (ref 0.44–1.00)
GFR, EST AFRICAN AMERICAN: 7 mL/min — AB (ref >60)
GFR, EST NON AFRICAN AMERICAN: 6 mL/min — AB (ref >60)
Glucose, Bld: 92 mg/dL (ref 65–99)
POTASSIUM: 4.1 mmol/L (ref 3.5–5.1)
Sodium: 138 mmol/L (ref 135–145)

## 2014-10-25 LAB — GLUCOSE, CAPILLARY
GLUCOSE-CAPILLARY: 101 mg/dL — AB (ref 65–99)
GLUCOSE-CAPILLARY: 77 mg/dL (ref 65–99)

## 2014-10-25 LAB — HEPATITIS B SURFACE ANTIGEN: HEP B S AG: NEGATIVE

## 2014-10-25 MED ORDER — BIOTENE DRY MOUTH MT LIQD: 15.0000 mL | OROMUCOSAL | Status: DC | PRN

## 2014-10-25 MED ORDER — DOXERCALCIFEROL 4 MCG/2ML IV SOLN
9.0000 ug | INTRAVENOUS | Status: DC
  Administered 2014-10-25 – 2014-10-27 (×2): 9 ug via INTRAVENOUS
  Filled 2014-10-25 (×3): qty 6

## 2014-10-25 MED ORDER — ACETAMINOPHEN 160 MG/5ML PO SOLN
650.0000 mg | Freq: Four times a day (QID) | ORAL | Status: DC | PRN
  Administered 2014-10-25 (×2): 650 mg
  Filled 2014-10-25 (×3): qty 20.3

## 2014-10-25 MED ORDER — FENTANYL CITRATE (PF) 100 MCG/2ML IJ SOLN
12.5000 ug | INTRAMUSCULAR | Status: DC | PRN
  Administered 2014-10-25 – 2014-10-26 (×2): 25 ug via INTRAVENOUS
  Filled 2014-10-25 (×2): qty 2

## 2014-10-25 MED ORDER — SODIUM CHLORIDE 0.9 % IV SOLN
125.0000 mg | INTRAVENOUS | Status: DC
  Administered 2014-10-27: 125 mg via INTRAVENOUS
  Filled 2014-10-25 (×2): qty 10

## 2014-10-25 NOTE — Progress Notes (Addendum)
[facility] Rounding Note  Subjective:  Remains intubated but awake and responding to questions Denies pain or SOB Received HD yesterday for APE with 2.6 liters of fluid removed/3.5 hour treatment Scheduled for another treatment today Has had persistent fevers TMax 100.9 Has a dialysis catheter  Objective:    Vital signs in last 24 hours: Filed Vitals:   10/25/14 0500 10/25/14 0600 10/25/14 0700 10/25/14 0722  BP:  141/67 163/63   Pulse:  84 84 81  Temp:  100.2 F (37.9 C) 100.2 F (37.9 C) 99.7 F (37.6 C)  TempSrc:    Oral  Resp:  23 24 24   Height:      Weight: 80 kg (176 lb 5.9 oz)     SpO2:  96% 98% 98%   Weight change: -28.1 kg (-61 lb 15.2 oz)  Intake/Output Summary (Last 24 hours) at 10/25/14 0802 Last data filed at 10/25/14 0600  Gross per 24 hour  Intake 805.73 ml  Output   3020 ml  Net -2214.27 ml    Physical Exam:  Blood pressure 163/63, pulse 81, temperature 99.7 F (37.6 C), temperature source Oral, resp. rate 24, height 5\' 3"  (1.6 m), weight 80 kg (176 lb 5.9 oz), SpO2 98 %. On vent, awake, alert and responding appropriately to questions (able to indicate to me what HD unit she goes to in [facility]) Right sided [facility] with clean dressing in place No rash, cyanosis or gangrene Sclera anicteric No JVD Chest clear bilat ant and lateral Regular rhythm  soft SEM 2/6 no rub or gallop Abd nondistended +BS no ascites Minimal if any LE edema  Labs:   Recent Labs Lab 10/23/14 2000 10/24/14 0225 10/25/14 0521  NA 143 142 138  K 3.8 4.8 4.1  CL 102 104 101  CO2 24 25 24   GLUCOSE 180* 87 92  BUN 32* 31* 18  CREATININE 7.38* 7.94* 6.39*  CALCIUM 9.0 9.2 8.8*  PHOS  --  7.1*  --      Recent Labs Lab 10/23/14 2000 10/24/14 0225  AST 84* 52*  ALT 63* 48  ALKPHOS 113 87  BILITOT 0.6 0.7  PROT 6.9 5.9*  ALBUMIN 3.9 3.2*    Recent Labs Lab 10/23/14 2000 10/24/14 0225 10/25/14 0521  WBC 20.6* 10.8* 9.8  NEUTROABS 17.0* 9.1*   --   HGB 12.5 11.2* 9.9*  HCT 39.6 35.3* 30.8*  MCV 101.8* 98.6 99.7  PLT 224 192 168   Recent Labs Lab 10/23/14 2000 10/23/14 2210 10/24/14 0225 10/24/14 1155 10/24/14 1708  TROPONINI 0.09* 0.10* 0.12* 0.14* 0.16*     Recent Labs Lab 10/25/14 0025 10/25/14 0321  GLUCAP 77 101*   Blood cultures (6/18) pending    Recent Labs Lab 10/24/14 1230  IRON 25*  TIBC 188*  FERRITIN 618*    Studies/Results: Dg Chest Port 1 View  10/25/2014   CLINICAL DATA:  Acute respiratory failure.  EXAM: PORTABLE CHEST - 1 VIEW  COMPARISON:  10/24/2014  FINDINGS: Endotracheal tube terminates approximately 2.5 cm above the carina. Right jugular central venous catheter terminates new the cavoatrial junction. Enteric tube courses into the left upper abdomen with tip not imaged. Cardiac silhouette is enlarged. There is a small veiling right pleural effusion. There is likely also a small left pleural effusion. Mild pulmonary edema persists, likely with superimposed atelectasis in both lung bases. No pneumothorax is identified.  IMPRESSION: Persistent mild pulmonary edema with small pleural effusions.   Electronically Signed   By: Zenia Resides  Jeralyn Ruths   On: 10/25/2014 07:16   Dg Chest Port 1 View  10/24/2014   CLINICAL DATA:  Pulmonary edema  EXAM: PORTABLE CHEST - 1 VIEW  COMPARISON:  10/23/2014  FINDINGS: Endotracheal tube in good position. Right jugular catheter tip in the SVC unchanged. No pneumothorax  Significant improvement in diffuse bilateral edema. Small right pleural effusion has developed since the prior study. Increase in bibasilar atelectasis  NG tube in the stomach  IMPRESSION: Endotracheal tube remains in good position.  Marked improvement in pulmonary edema. Mild edema and small right pleural effusion remain. Increase in bibasilar atelectasis.   Electronically Signed   By: Franchot Gallo M.D.   On: 10/24/2014 08:28   Dg Chest Portable 1 View  10/23/2014   CLINICAL DATA:  Hypoxia.  Chronic renal  failure  EXAM: PORTABLE CHEST - 1 VIEW  COMPARISON:  None.  FINDINGS: Endotracheal tube tip is 4.2 cm above the carina. Central catheter tip is in the superior vena cava. No pneumothorax. There is extensive interstitial and alveolar edema diffusely. Heart is enlarged with pulmonary venous hypertension. No apparent adenopathy.  IMPRESSION: Tube and catheter positions as described without pneumothorax. Evidence of congestive heart failure with extensive edema as well as cardiomegaly.   Electronically Signed   By: Lowella Grip III M.D.   On: 10/23/2014 20:29   Medications . nitroGLYCERIN 60 mcg/min (10/25/14 0517)  . propofol (DIPRIVAN) infusion Stopped (10/24/14 1400)   . amLODipine  10 mg Oral Daily  . antiseptic oral rinse  7 mL Mouth Rinse QID  . aspirin  324 mg Oral Once  . chlorhexidine  15 mL Mouth Rinse BID  . cloNIDine  0.3 mg Oral QHS  . doxazosin  2 mg Oral QHS  . famotidine (PEPCID) IV  20 mg Intravenous Q24H  . heparin  5,000 Units Subcutaneous 3 times per day  . hydrALAZINE  50 mg Oral BID  . metoprolol  100 mg Oral BID    I  have reviewed scheduled and prn medications.  Dialysis prescription: [facility] in [facility] [address] [phone]) MWF F180 3.75 hours 350/800 EDW 83.5 kg (last post wt 83.1) Will have lower EDW at discharge Hectorol 9 mcg TIW 7000 heparin Venofer 50/week Mircera 225 q2weeks - last received 6/13  Background: 64 y.o. year-old with hx of HTN, ESRD in MWF HD at [facility] in [facility]  who was admitted with severe hypertension,  respiratory distress requiring intubation, AMS. CXR initially showed bilat severe pulm edema. We were called to provide urgent dialysis for pulmonary edema.   ASSESSMENT/RECOMMENDATIONS  1. Acute hypoxemic resp failure / flash pulm edema - in association with severe HTN crisis. Had dialysis with removal of 2.6 liters of fluid last evening. CXR shows some residual edema and small effusions. 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 Regardless she needed HD, and will get a second treatment today. EDW unknown. Post HD weight 79.3 kg yesterday 2. VDRF - anticipate extubation today 3. ESRD on HD in [place] MWF 9 mos  4. Fever - no AVF, using catheter - concerning because of persistent fevers although exit site looks fine. Blood cultures are in process from 6/18. Not on ATB's but was given broad spectrum ATB's at outside hospital prior to transfer which may confound BC's..  5. HTN - BP's better. Continue meds 6. Anemia - will get details of  ESA dosing from her HD unit  7. Hx breast cancer 8. Hx "CHF" unknown type. ECHO pending. + trop leak.   Jamal Maes, MD [place] Pager 10/25/2014, 8:02 AM  ADD; Dialysis prescription as above Will add weekly Fe, hectorol Not due for ESA again until next week  Jamal Maes, MD [place] Pager 10/25/2014, 10:53 AM

## 2014-10-25 NOTE — Progress Notes (Signed)
Initial Nutrition Assessment  DOCUMENTATION CODES:  Obesity unspecified  INTERVENTION:  If pt remains intubated recommend   Initiate Nepro with Carb Steady @ 10 ml/hr via OG tube   60 ml Prostat TID.    Tube feeding regimen provides 1032 kcal, 109 grams of protein, and 174 ml of H2O.    NUTRITION DIAGNOSIS:  Inadequate oral intake related to inability to eat as evidenced by NPO status.   GOAL:  Provide needs based on ASPEN/SCCM guidelines   MONITOR:  TF tolerance, Vent status, Labs, I & O's  REASON FOR ASSESSMENT:  Malnutrition Screening Tool, Ventilator    ASSESSMENT:  Hx significant for end-stage renal disease on hemodialysis (she does make urine, though), CHF unknown type, breast cancer cancer.Last HD 6/17 @ [facility] Intubated for acute pulmonary edema & hypertensive emergency  Pt discussed during ICU rounds and with RN.  Possible plan for extubation after HD today.   Pt asleep receiving HD while on vent. Pt woke easily but unable to answer questions due to tube.  Physical Exam did not reveal any depletion, pt does have edema.   Patient is currently intubated on ventilator support. Has OG tube in place.  MV: 6.5 L/min Temp (24hrs), Avg:100.1 F (37.8 C), Min:99.3 F (37.4 C), Max:101.1 F (38.4 C)  Labs and mediations reviewed: Cr elevated   Height:  Ht Readings from Last 1 Encounters:  10/24/14 5\' 3"  (1.6 m)    Weight:  Wt Readings from Last 1 Encounters:  10/25/14 176 lb 5.9 oz (80 kg)    Ideal Body Weight:  52.2 kg  Wt Readings from Last 10 Encounters:  10/25/14 176 lb 5.9 oz (80 kg)    BMI:  Body mass index is 31.25 kg/(m^2).  Estimated Nutritional Needs:  Kcal:  [phone]  Protein:  >/= 104 grams  Fluid:  1.2 L/day  Skin:  Reviewed, no issues  Diet Order:  Diet NPO time specified  EDUCATION NEEDS:  No education needs identified at this time   Intake/Output Summary (Last 24 hours) at 10/25/14 1459 Last data filed  at 10/25/14 1400  Gross per 24 hour  Intake 707.36 ml  Output   3130 ml  Net -2422.64 ml    Last BM:  PTA  Maylon Peppers RD, [place], [place] Pager [phone] After Hours Pager

## 2014-10-25 NOTE — Progress Notes (Signed)
eLink Physician-Brief Progress Note Patient Name: Courtney Bullock DOB: 1950/06/29 MRN: [id number]   Date of Service  10/25/2014  HPI/Events of Note  Respiratory failure due to pulmonary edema from hypertension and ESRD, SBT all day, just received HD, 3 L removed Still hypertensive headache  eICU Interventions  Give nighttime clonidine now via tube Fentanyl IV low dose now for headache Extubate     Intervention Category Major Interventions: Respiratory failure - evaluation and management;Hypertension - evaluation and management  MCQUAID, DOUGLAS 10/25/2014, 5:10 PM

## 2014-10-25 NOTE — Progress Notes (Signed)
PULMONARY / CRITICAL CARE MEDICINE   Name: Courtney Bullock MRN: [id number] DOB: July 29, 1950    ADMISSION DATE:  10/23/2014  CHIEF COMPLAINT:  Shortness of breath  BRIEF Patient is a 64 year old female with a past medical history significant for end-stage renal disease on hemodialysis (she does make urine, though), CHF unknown type, breast cancer cancer.Last HD 6/17 @ [facility] Intubated for acute pulmonary edema & hypertensive emergency  10/24/14:  SUBJ  - awake, interactive, writing  Afebrile On PEEP 10   SUBJECTIVE/OVERNIGHT/INTERVAL HX 10/25/14: Last HD yesterda 10/24/14 and Per RN 2.5L removed.  Repeat HD pending around 13.00h 10/25/2014. aPatient on SBT. However, patient demanding extubtion immediately. CXR still wet though and running low grade fever. stil on nitro gtt   VITAL SIGNS: Temp:  [97.7 F (36.5 C)-101.1 F (38.4 C)] 99.3 F (37.4 C) (06/20 0907) Pulse Rate:  [79-111] 93 (06/20 0907) Resp:  [7-35] 19 (06/20 0907) BP: (92-193)/(34-95) 182/94 mmHg (06/20 0907) SpO2:  [96 %-100 %] 100 % (06/20 0907) FiO2 (%):  [40 %-50 %] 40 % (06/20 0907) Weight:  [79.3 kg (174 lb 13.2 oz)-81.9 kg (180 lb 8.9 oz)] 80 kg (176 lb 5.9 oz) (06/20 0500) HEMODYNAMICS:   VENTILATOR SETTINGS: Vent Mode:  [-] PSV FiO2 (%):  [40 %-50 %] 40 % Set Rate:  [24 bmp] 24 bmp Vt Set:  [400 mL] 400 mL PEEP:  [5 cmH20] 5 cmH20 Pressure Support:  [5 cmH20] 5 cmH20 Plateau Pressure:  [14 cmH20-19 cmH20] 16 cmH20 INTAKE / OUTPUT:  Intake/Output Summary (Last 24 hours) at 10/25/14 1002 Last data filed at 10/25/14 0900  Gross per 24 hour  Intake 814.78 ml  Output   3075 ml  Net -2260.22 ml    PHYSICAL EXAMINATION: General:   appears her appear stated age in no acute distress. She appears well-nourished and well-developed Neuro: RASS -1. CAM-ICU negative for deliirum. Moves all 4s. Not on sedation  HEENT:ETT in place, fair dentition.  Cardiovascular: S1S2 RRR no murmurs, rubs, or gallops  auscultated. No thrills palpated. She has a dialysis catheter in her right upper chest and the scar of her port in her left upper chest. She does have 1+ pitting edema in bilateral lower extremities.  Lungs:  Chest symmetrical with respirations, clear to auscultation bilaterally with no wheezing, some crackles, equal expansion.  Abdomen:  Soft, nontender, no guarding, nondistended. Present bowel sounds.  Musculoskeletal:  No muscle atrophy noted nor weakness. . No swelling nor tenderness of the joints.   Skin:  No notable scars, rashes, cruises. No bed sores noted.   Lymphatics: no palpable lymphadenopathy of cervical, supraclvicular nor inguinal areas.    LABS:  PULMONARY  Recent Labs Lab 10/23/14 2027 10/23/14 2142 10/24/14 0145  PHART 7.298* 7.403 7.480*  PCO2ART 53.8* 46.0* 33.4*  PO2ART 102.0* 143.0* 218*  HCO3 26.3* 28.7* 24.6*  TCO2 28 30 25.7  O2SAT 97.0 99.0 99.8    CBC  Recent Labs Lab 10/23/14 2000 10/24/14 0225 10/25/14 0521  HGB 12.5 11.2* 9.9*  HCT 39.6 35.3* 30.8*  WBC 20.6* 10.8* 9.8  PLT 224 192 168    COAGULATION  Recent Labs Lab 10/23/14 2000  INR 1.03    CARDIAC   Recent Labs Lab 10/23/14 2000 10/23/14 2210 10/24/14 0225 10/24/14 1155 10/24/14 1708  TROPONINI 0.09* 0.10* 0.12* 0.14* 0.16*   No results for input(s): PROBNP in the last 168 hours.   CHEMISTRY  Recent Labs Lab 10/23/14 2000 10/24/14 0225 10/25/14 0521  NA 143 142  138  K 3.8 4.8 4.1  CL 102 104 101  CO2 24 25 24   GLUCOSE 180* 87 92  BUN 32* 31* 18  CREATININE 7.38* 7.94* 6.39*  CALCIUM 9.0 9.2 8.8*  MG  --  2.3  --   PHOS  --  7.1*  --    Estimated Creatinine Clearance: 9.1 mL/min (by C-G formula based on Cr of 6.39).   LIVER  Recent Labs Lab 10/23/14 2000 10/24/14 0225  AST 84* 52*  ALT 63* 48  ALKPHOS 113 87  BILITOT 0.6 0.7  PROT 6.9 5.9*  ALBUMIN 3.9 3.2*  INR 1.03  --      INFECTIOUS  Recent Labs Lab 10/23/14 2103  10/24/14 0225  LATICACIDVEN 2.66*  --   PROCALCITON  --  2.47     ENDOCRINE CBG (last 3)   Recent Labs  10/25/14 0025 10/25/14 0321  GLUCAP 77 101*         IMAGING x48h  - image(s) personally visualized  -   highlighted in bold Dg Chest Port 1 View  10/25/2014   CLINICAL DATA:  Acute respiratory failure.  EXAM: PORTABLE CHEST - 1 VIEW  COMPARISON:  10/24/2014  FINDINGS: Endotracheal tube terminates approximately 2.5 cm above the carina. Right jugular central venous catheter terminates new the cavoatrial junction. Enteric tube courses into the left upper abdomen with tip not imaged. Cardiac silhouette is enlarged. There is a small veiling right pleural effusion. There is likely also a small left pleural effusion. Mild pulmonary edema persists, likely with superimposed atelectasis in both lung bases. No pneumothorax is identified.  IMPRESSION: Persistent mild pulmonary edema with small pleural effusions.   Electronically Signed   By: Logan Bores   On: 10/25/2014 07:16   Dg Chest Port 1 View  10/24/2014   CLINICAL DATA:  Pulmonary edema  EXAM: PORTABLE CHEST - 1 VIEW  COMPARISON:  10/23/2014  FINDINGS: Endotracheal tube in good position. Right jugular catheter tip in the SVC unchanged. No pneumothorax  Significant improvement in diffuse bilateral edema. Small right pleural effusion has developed since the prior study. Increase in bibasilar atelectasis  NG tube in the stomach  IMPRESSION: Endotracheal tube remains in good position.  Marked improvement in pulmonary edema. Mild edema and small right pleural effusion remain. Increase in bibasilar atelectasis.   Electronically Signed   By: Franchot Gallo M.D.   On: 10/24/2014 08:28   Dg Chest Portable 1 View  10/23/2014   CLINICAL DATA:  Hypoxia.  Chronic renal failure  EXAM: PORTABLE CHEST - 1 VIEW  COMPARISON:  None.  FINDINGS: Endotracheal tube tip is 4.2 cm above the carina. Central catheter tip is in the superior vena cava. No  pneumothorax. There is extensive interstitial and alveolar edema diffusely. Heart is enlarged with pulmonary venous hypertension. No apparent adenopathy.  IMPRESSION: Tube and catheter positions as described without pneumothorax. Evidence of congestive heart failure with extensive edema as well as cardiomegaly.   Electronically Signed   By: Lowella Grip III M.D.   On: 10/23/2014 20:29        ASSESSMENT / PLAN:  PULMONARY OETT  10/23/2014 >> A: Acute hypoxemic respiratory failure secondary to acute  pulmonary edema   - doing SBT well. Demanding extubation. CXR still went  P:   Extubate 10/25/2014 after 3pm after 2nd HD if pattient will accept this plan; eMD to assist Otherwise, extubation now poses high risk reintubation   CARDIOVASCULAR A: Hypertensive emergency with end organ damage being flash  pulmonary edema History of heart failure unknown type, acute exacerbation of this Elevated troponin of 0.12, likely secondary to hypertensive emergency  - still on nitro gtt 10/25/14  P:  Titrate nitro gtt off    RENAL A:  End-stage renal disease on hemodialysis M/W/F  patient does not have any electrolyte abnormalities warranting emergent dialysis at this time -UA with significant proteinuria  S/p HD 10/24/14 and repeat HD 10/25/14 for volume removal pending. CXR still wet  P:   consulted nephrology,  Hemodialysis planned again 11/04/14  GASTROINTESTINAL A:  Slight transaminitis with increased AST of 52 possibly secondary to congestive hepatopathy, improved P:  GI prophylaxis with once a day famotidine   HEMATOLOGIC/oncologic A:  History of breast cancer now in remission for the last 2 years Normocytic anemia, likely of chronic disease H/o PICA -eating sand P: Await fe def panel   INFECTIOUS A:  Patient does not appear to have a source of infection at this time. She did receive broad-spectrum antibiotics empirically in the emergency department of outside hospital. The  leukocytosis noted at outside hospitals now resolved / Having low grade fever 10/25/14     P:   Follow up blood cultures taken at outside hospital Vanc 10/23/14 >>10/23/14 Zosyn 10/23/14 >10/23/14  ENDOCRINE A:  No active issues   P:     NEUROLOGIC A:  No active issues,    - doing well on prn sedation  P:  RASS goal: -1 to 0 DC diprivan order Fent prn only  FAMILY  - Updates: Daughter and husband at bedside 10/24/14. Patinet updated 10/25/14 at bedside by PCCM MD   - Inter-disciplinary family meet or Palliative Care meeting due by:  day 7  Summary -Acute pulm edema is resolving - htn urgency, doubt ACS, Aim extubation 10/25/14 3pm after her HD at 1pm 10/25/2014      The patient is critically ill with multiple organ systems failure and requires high complexity decision making for assessment and support, frequent evaluation and titration of therapies, application of advanced monitoring technologies and extensive interpretation of multiple databases.   Critical Care Time devoted to patient care services described in this note is  30  Minutes. This time reflects time of care of this signee Dr Brand Males. This critical care time does not reflect procedure time, or teaching time or supervisory time of PA/NP/Med student/Med Resident etc but could involve care discussion time    Dr. Brand Males, M.D., [facility].C.P Pulmonary and Critical Care Medicine Staff Physician [place] [facility] Pager: [phone], If no answer or between  15:00h - 7:00h: call [phone]  10/25/2014 10:12 AM

## 2014-10-25 NOTE — Progress Notes (Signed)
   10/25/14 0904  Vent Select  Invasive or Noninvasive Invasive  Adult Vent Y  Airway 7.5 mm  Placement Date/Time: 10/23/14 2025   Grade View: Grade 1  Placed By: ED Physician  Airway Device: Endotracheal Tube  Laryngoscope Blade: MAC;3  ETT Types: Oral  Size (mm): 7.5 mm  Cuffed: Cuffed  Insertion attempts: 1  Airway Equipment: Video Laryngos...  Secured at (cm) 25 cm  Measured From [place] By [employer] fundraiser  Adult Ventilator Settings  Vent Type Servo i  Humidity HME  Vent Mode PSV  FiO2 (%) 40 %  Pressure Support 5 cmH20  PEEP 5 cmH20  Adult Ventilator Measurements  Peak Airway Pressure 11 L/min  Mean Airway Pressure 7 cmH20  Resp Rate Spontaneous 24 br/min  Exhaled Vt 410 mL  Measured Ve 6.5 mL  Total PEEP 5 cmH20  HOB> 30 Degrees Y  Adult Ventilator Alarms  Alarms On Y  Ve High Alarm 21 L/min  Ve Low Alarm 5 L/min  Resp Rate High Alarm 40 br/min  Resp Rate Low Alarm 5  PEEP Low Alarm 3 cmH2O  Press High Alarm 45 cmH2O  T Apnea 20 sec(s)  Breath Sounds  Bilateral Breath Sounds Rhonchi  Suction Method  Suctioning Airway  Airway Suctioning/Secretions  Suction Type ETT  Suction Device  Inline  Secretion Amount None  Suction Tolerance Tolerated well  Suctioning Adverse Effects None  Placed patient on 5/5 psv.  Patient is tolerating well.  RN aware.

## 2014-10-25 NOTE — Progress Notes (Signed)
Patient and order verified.  Extubated to 4lpm nasal canula.  Berniece Salines RN present at bedside.  Patient is tolerating well.

## 2014-10-26 LAB — CBC
HEMATOCRIT: 32.2 % — AB (ref 36.0–46.0)
Hemoglobin: 10.2 g/dL — ABNORMAL LOW (ref 12.0–15.0)
MCH: 31.9 pg (ref 26.0–34.0)
MCHC: 31.7 g/dL (ref 30.0–36.0)
MCV: 100.6 fL — ABNORMAL HIGH (ref 78.0–100.0)
PLATELETS: 187 10*3/uL (ref 150–400)
RBC: 3.2 MIL/uL — ABNORMAL LOW (ref 3.87–5.11)
RDW: 18.4 % — AB (ref 11.5–15.5)
WBC: 11.3 10*3/uL — AB (ref 4.0–10.5)

## 2014-10-26 LAB — RENAL FUNCTION PANEL
ANION GAP: 12 (ref 5–15)
Albumin: 3.2 g/dL — ABNORMAL LOW (ref 3.5–5.0)
BUN: 14 mg/dL (ref 6–20)
CO2: 25 mmol/L (ref 22–32)
Calcium: 9.1 mg/dL (ref 8.9–10.3)
Chloride: 100 mmol/L — ABNORMAL LOW (ref 101–111)
Creatinine, Ser: 4.84 mg/dL — ABNORMAL HIGH (ref 0.44–1.00)
GFR calc Af Amer: 10 mL/min — ABNORMAL LOW (ref >60)
GFR calc non Af Amer: 9 mL/min — ABNORMAL LOW (ref >60)
GLUCOSE: 93 mg/dL (ref 65–99)
Phosphorus: 5.7 mg/dL — ABNORMAL HIGH (ref 2.5–4.6)
Potassium: 4.5 mmol/L (ref 3.5–5.1)
Sodium: 137 mmol/L (ref 135–145)

## 2014-10-26 MED ORDER — HYDRALAZINE HCL 50 MG PO TABS
100.0000 mg | ORAL_TABLET | Freq: Two times a day (BID) | ORAL | Status: DC
  Administered 2014-10-26 – 2014-10-27 (×3): 100 mg via ORAL
  Filled 2014-10-26 (×4): qty 2

## 2014-10-26 MED ORDER — FERROUS SULFATE 325 (65 FE) MG PO TABS
325.0000 mg | ORAL_TABLET | Freq: Two times a day (BID) | ORAL | Status: DC
  Administered 2014-10-26 – 2014-10-27 (×2): 325 mg via ORAL
  Filled 2014-10-26 (×4): qty 1

## 2014-10-26 MED ORDER — CLONIDINE HCL 0.3 MG PO TABS
0.3000 mg | ORAL_TABLET | Freq: Two times a day (BID) | ORAL | Status: DC
  Administered 2014-10-26 – 2014-10-27 (×3): 0.3 mg via ORAL
  Filled 2014-10-26 (×4): qty 1

## 2014-10-26 NOTE — Progress Notes (Addendum)
[facility] Rounding Note  Subjective:  Received HD again yesterday - another 3 liters off She is now about 5.9 kg below her outpt EDW Extubated yesterday, breathing fine, walked in the hall without O2. Anxious to get out of the hospital Objective:    Vital signs in last 24 hours: Filed Vitals:   10/26/14 0600 10/26/14 0700 10/26/14 0726 10/26/14 0800  BP: 157/74 164/71    Pulse: 81 72  89  Temp:   98.8 F (37.1 C)   TempSrc:   Oral   Resp: 21 23    Height:      Weight:      SpO2: 97% 100%  95%   Weight change: -2.1 kg (-4 lb 10.1 oz)  Intake/Output Summary (Last 24 hours) at 10/26/14 0852 Last data filed at 10/26/14 0800  Gross per 24 hour  Intake    652 ml  Output   3105 ml  Net  -2453 ml    Physical Exam:  Blood pressure 164/71, pulse 89, temperature 98.8 F (37.1 C), temperature source Oral, resp. rate 23, height 5\' 3"  (1.6 m), weight 75.8 kg (167 lb 1.7 oz), SpO2 95 %. Sitting in the chair, no O2, pleasant, alert Right sided TDC with clean dressing in place No rash, cyanosis or gangrene No JVD Lungs anteriorly clear, posteriorly mild diminution in BS Regular rhythm  soft SEM 2/6 no rub or gallop Abd nondistended +BS Non tender No LE edema  Labs:   Recent Labs Lab 10/23/14 2000 10/24/14 0225 10/25/14 0521 10/26/14 0344  NA 143 142 138 137  K 3.8 4.8 4.1 4.5  CL 102 104 101 100*  CO2 24 25 24 25   GLUCOSE 180* 87 92 93  BUN 32* 31* 18 14  CREATININE 7.38* 7.94* 6.39* 4.84*  CALCIUM 9.0 9.2 8.8* 9.1  PHOS  --  7.1*  --  5.7*     Recent Labs Lab 10/23/14 2000 10/24/14 0225 10/26/14 0344  AST 84* 52*  --   ALT 63* 48  --   ALKPHOS 113 87  --   BILITOT 0.6 0.7  --   PROT 6.9 5.9*  --   ALBUMIN 3.9 3.2* 3.2*    Recent Labs Lab 10/23/14 2000 10/24/14 0225 10/25/14 0521 10/26/14 0344  WBC 20.6* 10.8* 9.8 11.3*  NEUTROABS 17.0* 9.1*  --   --   HGB 12.5 11.2* 9.9* 10.2*  HCT 39.6 35.3* 30.8* 32.2*  MCV 101.8* 98.6 99.7  100.6*  PLT 224 192 168 187    Recent Labs Lab 10/23/14 2000 10/23/14 2210 10/24/14 0225 10/24/14 1155 10/24/14 1708  TROPONINI 0.09* 0.10* 0.12* 0.14* 0.16*     Recent Labs Lab 10/25/14 0025 10/25/14 0321  GLUCAP 77 101*   Blood cultures (6/18) pending     Recent Labs Lab 10/24/14 1230  IRON 25*  TIBC 188*  FERRITIN 618*    Studies/Results: Dg Chest Port 1 View  10/25/2014   CLINICAL DATA:  Acute respiratory failure.  EXAM: PORTABLE CHEST - 1 VIEW  COMPARISON:  10/24/2014  FINDINGS: Endotracheal tube terminates approximately 2.5 cm above the carina. Right jugular central venous catheter terminates new the cavoatrial junction. Enteric tube courses into the left upper abdomen with tip not imaged. Cardiac silhouette is enlarged. There is a small veiling right pleural effusion. There is likely also a small left pleural effusion. Mild pulmonary edema persists, likely with superimposed atelectasis in both lung bases. No pneumothorax is identified.  IMPRESSION: Persistent mild pulmonary edema  with small pleural effusions.   Electronically Signed   By: Logan Bores   On: 10/25/2014 07:16   Medications . nitroGLYCERIN 60 mcg/min (10/26/14 0700)   . amLODipine  10 mg Oral Daily  . aspirin  324 mg Oral Once  . cloNIDine  0.3 mg Oral QHS  . doxazosin  2 mg Oral QHS  . doxercalciferol  9 mcg Intravenous Q M,W,F-HD  . famotidine (PEPCID) IV  20 mg Intravenous Q24H  . [START ON 10/27/2014] ferric gluconate (FERRLECIT/NULECIT) IV  125 mg Intravenous Q Wed-HD  . heparin  5,000 Units Subcutaneous 3 times per day  . hydrALAZINE  50 mg Oral BID  . metoprolol  100 mg Oral BID    I  have reviewed scheduled and prn medications.  Dialysis prescription: [facility] in [facility] [address] [phone]) MWF F180 3.75 hours 350/800 EDW 83.5 kg (last post wt 83.1) Will have lower EDW at discharge Hectorol 9 mcg TIW 7000 heparin Venofer 50/week Mircera 225 q2weeks - last  received 6/13  Background: 64 y.o. year-old with hx of HTN, ESRD in MWF HD at [facility] in [facility]  who was admitted with severe hypertension,  respiratory distress requiring intubation, AMS. CXR initially showed bilat severe pulm edema. We were called to provide urgent dialysis for pulmonary edema.   ASSESSMENT/RECOMMENDATIONS  1. Acute hypoxemic resp failure / flash pulm edema - in association with severe HTN crisis. S/p HD X 2 with reduction in weight of close to 6 kg. She will have a lower EDW at discharge. Malignant HTN + volume overload responsible for APE which is resolved, at least by exam. No CXR today. No O2 requirement.  2. VDRF - resolved. 3. ESRD on HD in [place] MWF. Next HD 6/22 4. Fever - afebrile this AM. No AVF, uses catheter. Blood cultures X 2 negative to date and leukocytosis improved.  Not on ATB's  5. HTN - BP's better but still high (she says "high all my life" . Husband to bring in meds today for formal med rec. In the meantime, increase hydralazine to 100 mg BID. Same clonidine, metoprolol, clonidine, doxazosin 6. Anemia - Rec'd Mircera 225 mcg on 6/13. Not due for redosing. Hb just over 10 - goal. 7. Hx breast cancer s/p rad rx and chemo. On outpt med ? Letrozole (husband to bring list) 8. Hx "CHF" unknown type. EF 65-70% by ECHO 6/19. + trop leak likely demand ischemia from malh htn. No chest pain 9. Disposition - OK from my standpoint to transfer to the floor. Next HD in AM.   Jamal Maes, MD [place] Pager 10/26/2014, 8:52 AM

## 2014-10-26 NOTE — Progress Notes (Signed)
PULMONARY / CRITICAL CARE MEDICINE   Name: Courtney Bullock MRN: [id number] DOB: 02/13/1951    ADMISSION DATE:  10/23/2014  CHIEF COMPLAINT:  Shortness of breath  BRIEF Patient is a 64 year old female with a past medical history significant for end-stage renal disease on hemodialysis (she does make urine, though), CHF unknown type, breast cancer cancer.Last HD 6/17 @ [facility] Intubated for acute pulmonary edema & hypertensive emergency  SUBJECTIVE/OVERNIGHT/INTERVAL HX Extubated last pm, on RA now She is 6kg below her prior dry wt after HD treatments here    VITAL SIGNS: Temp:  [98.8 F (37.1 C)-100.1 F (37.8 C)] 98.8 F (37.1 C) (06/21 0726) Pulse Rate:  [69-95] 88 (06/21 1100) Resp:  [2-25] 19 (06/21 1100) BP: (150-196)/(65-111) 185/84 mmHg (06/21 1100) SpO2:  [93 %-100 %] 96 % (06/21 1100) FiO2 (%):  [40 %] 40 % (06/20 1700) Weight:  [75.8 kg (167 lb 1.7 oz)-79.8 kg (175 lb 14.8 oz)] 75.8 kg (167 lb 1.7 oz) (06/21 0500) HEMODYNAMICS:   VENTILATOR SETTINGS: Vent Mode:  [-] PSV FiO2 (%):  [40 %] 40 % PEEP:  [5 cmH20] 5 cmH20 Pressure Support:  [5 cmH20] 5 cmH20 INTAKE / OUTPUT:  Intake/Output Summary (Last 24 hours) at 10/26/14 1125 Last data filed at 10/26/14 1100  Gross per 24 hour  Intake    582 ml  Output   3105 ml  Net  -2523 ml    PHYSICAL EXAMINATION: General:   appears her appear stated age in no acute distress. She appears well-nourished and well-developed Neuro: RASS 0. CAM-ICU negative for deliirum. Moves all 4s. Not on sedation  HEENT:ETT in place, fair dentition.  Cardiovascular: S1S2 RRR no murmurs, rubs, or gallops auscultated. No thrills palpated. She has a dialysis catheter in her right upper chest and the scar of her port in her left upper chest. She does have 1+ pitting edema in bilateral lower extremities.  Lungs:  Chest symmetrical with respirations, clear to auscultation bilaterally with no wheezing or crackles, equal expansion.  Abdomen:   Soft, nontender, no guarding, nondistended. Present bowel sounds.  Musculoskeletal:  No muscle atrophy noted nor weakness. . No swelling nor tenderness of the joints.   Skin:  No notable scars, rashes, cruises. No bed sores noted.   Lymphatics: no palpable lymphadenopathy of cervical, supraclvicular nor inguinal areas.    LABS:  PULMONARY  Recent Labs Lab 10/23/14 2027 10/23/14 2142 10/24/14 0145  PHART 7.298* 7.403 7.480*  PCO2ART 53.8* 46.0* 33.4*  PO2ART 102.0* 143.0* 218*  HCO3 26.3* 28.7* 24.6*  TCO2 28 30 25.7  O2SAT 97.0 99.0 99.8    CBC  Recent Labs Lab 10/24/14 0225 10/25/14 0521 10/26/14 0344  HGB 11.2* 9.9* 10.2*  HCT 35.3* 30.8* 32.2*  WBC 10.8* 9.8 11.3*  PLT 192 168 187    COAGULATION  Recent Labs Lab 10/23/14 2000  INR 1.03    CARDIAC    Recent Labs Lab 10/23/14 2000 10/23/14 2210 10/24/14 0225 10/24/14 1155 10/24/14 1708  TROPONINI 0.09* 0.10* 0.12* 0.14* 0.16*   No results for input(s): PROBNP in the last 168 hours.   CHEMISTRY  Recent Labs Lab 10/23/14 2000 10/24/14 0225 10/25/14 0521 10/26/14 0344  NA 143 142 138 137  K 3.8 4.8 4.1 4.5  CL 102 104 101 100*  CO2 24 25 24 25   GLUCOSE 180* 87 92 93  BUN 32* 31* 18 14  CREATININE 7.38* 7.94* 6.39* 4.84*  CALCIUM 9.0 9.2 8.8* 9.1  MG  --  2.3  --   --  PHOS  --  7.1*  --  5.7*   Estimated Creatinine Clearance: 11.8 mL/min (by C-G formula based on Cr of 4.84).   LIVER  Recent Labs Lab 10/23/14 2000 10/24/14 0225 10/26/14 0344  AST 84* 52*  --   ALT 63* 48  --   ALKPHOS 113 87  --   BILITOT 0.6 0.7  --   PROT 6.9 5.9*  --   ALBUMIN 3.9 3.2* 3.2*  INR 1.03  --   --      INFECTIOUS  Recent Labs Lab 10/23/14 2103 10/24/14 0225  LATICACIDVEN 2.66*  --   PROCALCITON  --  2.47     ENDOCRINE CBG (last 3)   Recent Labs  10/25/14 0025 10/25/14 0321  GLUCAP 77 101*      IMAGING x48h Dg Chest Port 1 View  10/25/2014   CLINICAL DATA:  Acute  respiratory failure.  EXAM: PORTABLE CHEST - 1 VIEW  COMPARISON:  10/24/2014  FINDINGS: Endotracheal tube terminates approximately 2.5 cm above the carina. Right jugular central venous catheter terminates new the cavoatrial junction. Enteric tube courses into the left upper abdomen with tip not imaged. Cardiac silhouette is enlarged. There is a small veiling right pleural effusion. There is likely also a small left pleural effusion. Mild pulmonary edema persists, likely with superimposed atelectasis in both lung bases. No pneumothorax is identified.  IMPRESSION: Persistent mild pulmonary edema with small pleural effusions.   Electronically Signed   By: Logan Bores   On: 10/25/2014 07:16      ASSESSMENT / PLAN:  PULMONARY OETT  10/23/2014 >> 6/20 A: Acute hypoxemic respiratory failure secondary to acute  pulmonary edema P:   Tolerated extubation, now on RA Repeat CXR am  Need tight BP control and note new dry weight based on Renal eval this admission   CARDIOVASCULAR A: Hypertensive emergency with end organ damage, flash pulmonary edema History of heart failure unknown type, acute exacerbation Elevated troponin of 0.12, likely secondary to hypertensive emergency  P:  Back on amlodipine, doxazosin, hydralazine, metoprolol Change clonidine to 0.3mg  BID, ? Whether this was ordered this way to avoid lows during her HD sessions Titrate nitro gtt off 6/21   RENAL A:  End-stage renal disease on hemodialysis M/W/F  S/p HD 10/24/14 and repeat HD 10/25/14  P:  Dry wt will likely be ~6kg lower than her prior, appreciate Dr Sanda Klein assistance.   GASTROINTESTINAL A:  Slight transaminitis with increased AST of 52 possibly secondary to congestive hepatopathy, improved P:  GI prophylaxis with once a day famotidine   HEMATOLOGIC/oncologic A:  History of breast cancer now in remission for the last 2 years Normocytic anemia, likely of chronic disease H/o PICA -eating sand Fe deficiency   P: Start FeSO4 in addition to her Fe that she receives at HD   INFECTIOUS A:  Patient does not appear to have a source of infection at this time. She did receive broad-spectrum antibiotics empirically in the emergency department of outside hospital. The leukocytosis noted at outside hospitals now resolved   P:   Follow up blood cultures taken at outside hospital Vanc 10/23/14 >>10/23/14 Zosyn 10/23/14 >10/23/14  ENDOCRINE A:  No active issues   P:    NEUROLOGIC A:  No active issues,    FAMILY  - Updates: Daughter and husband at bedside 10/24/14. Patinet updated 6/21 by Dr Lamonte Sakai  Transfer to floor bed on 6/21 when NTG gtt off, then likely d/c home 6/22   Baltazar Apo,  MD, PhD 10/26/2014, 11:39 AM [place] [facility] [phone] or if no answer [phone]

## 2014-10-26 NOTE — Evaluation (Signed)
Physical Therapy Evaluation Patient Details Name: Courtney Bullock MRN: [id number] DOB: 1951/04/25 Today's Date: 10/26/2014   History of Present Illness  Patient is a 64 year old female with a past medical history significant for end-stage renal disease on hemodialysis (she does make urine, though), CHF unknown type, breast cancer cancer.Last HD 6/17 @ [facility], intubated for HTN emergency and pulmonary edema , VDRF 6/18-6/20  Clinical Impression  Pt moving well but slow to mobilize due to being tired and initially very slow to respond to questions or commands. Once up and walking much more communicative. Pt with cues for balance and safety with gait presumed due to hospital associated weakness and anticipate quick recovery. Pt will benefit from acute therapy to maximize mobility and gait for safe return home. Recommend daily ambulation with nursing staff.     Follow Up Recommendations No PT follow up    Equipment Recommendations  None recommended by PT    Recommendations for Other Services       Precautions / Restrictions Restrictions Weight Bearing Restrictions: No      Mobility  Bed Mobility Overal bed mobility: Modified Independent                Transfers Overall transfer level: Needs assistance   Transfers: Sit to/from Stand Sit to Stand: Supervision         General transfer comment: supervision for safety, lines and balance  Ambulation/Gait Ambulation/Gait assistance: Min guard Ambulation Distance (Feet): 500 Feet Assistive device: None Gait Pattern/deviations: Step-through pattern;Decreased stride length   Gait velocity interpretation: Below normal speed for age/gender General Gait Details: unsteady gait but pt with slow cautious gait to control balance and utilizing IV pole for support and not yet ready to fully attempt without assist  Stairs            Wheelchair Mobility    Modified Rankin (Stroke Patients Only)       Balance Overall  balance assessment: Needs assistance   Sitting balance-Leahy Scale: Good       Standing balance-Leahy Scale: Fair                               Pertinent Vitals/Pain Pain Assessment: 0-10 Pain Score: 9  Pain Location: head Pain Descriptors / Indicators: Aching Pain Intervention(s): Repositioned;Patient requesting pain meds-RN notified  HR 72 sats 96% on RA 164/71    Home Living Family/patient expects to be discharged to:: Private residence Living Arrangements: Spouse/significant other Available Help at Discharge: Family Type of Home: House Home Access: Stairs to enter   Technical brewer of Steps: 3 Home Layout: One level Home Equipment: None      Prior Function Level of Independence: Independent               Hand Dominance        Extremity/Trunk Assessment   Upper Extremity Assessment: Overall WFL for tasks assessed           Lower Extremity Assessment: Overall WFL for tasks assessed      Cervical / Trunk Assessment: Normal  Communication   Communication: No difficulties  Cognition Arousal/Alertness: Awake/alert Behavior During Therapy: Flat affect Overall Cognitive Status: Within Functional Limits for tasks assessed                      General Comments      Exercises        Assessment/Plan    PT Assessment  Patient needs continued PT services  PT Diagnosis Difficulty walking   PT Problem List Decreased activity tolerance;Decreased balance;Decreased mobility  PT Treatment Interventions Gait training;Functional mobility training;Stair training;Therapeutic activities;Therapeutic exercise;Balance training;Patient/family education   PT Goals (Current goals can be found in the Care Plan section) Acute Rehab PT Goals Patient Stated Goal: return home PT Goal Formulation: With patient Time For Goal Achievement: 11/02/14 Potential to Achieve Goals: Good    Frequency Min 3X/week   Barriers to discharge         Co-evaluation               End of Session   Activity Tolerance: Patient tolerated treatment well Patient left: in chair;with chair alarm set;with call bell/phone within reach Nurse Communication: Mobility status         Time: 0929-5747 PT Time Calculation (min) (ACUTE ONLY): 37 min   Charges:   PT Evaluation $Initial PT Evaluation Tier I: 1 Procedure PT Treatments $Therapeutic Activity: 8-22 mins   PT G CodesMelford Aase 10/26/2014, 10:40 AM Elwyn Reach, [place]

## 2014-10-26 NOTE — Progress Notes (Signed)
Patient transferred from 2 Heart via wheel chair. Patient is alert&orientedx4. Denies any pain, right hand IV, saline locked. Patient oriented to staff and floor. Call bell Family at bedside. Will continue to monitor.

## 2014-10-27 DIAGNOSIS — Z992 Dependence on renal dialysis: Secondary | ICD-10-CM

## 2014-10-27 DIAGNOSIS — J810 Acute pulmonary edema: Principal | ICD-10-CM

## 2014-10-27 DIAGNOSIS — I10 Essential (primary) hypertension: Secondary | ICD-10-CM

## 2014-10-27 DIAGNOSIS — J9601 Acute respiratory failure with hypoxia: Secondary | ICD-10-CM

## 2014-10-27 DIAGNOSIS — N186 End stage renal disease: Secondary | ICD-10-CM

## 2014-10-27 LAB — RENAL FUNCTION PANEL
Albumin: 2.9 g/dL — ABNORMAL LOW (ref 3.5–5.0)
Anion gap: 14 (ref 5–15)
BUN: 38 mg/dL — ABNORMAL HIGH (ref 6–20)
CALCIUM: 9.1 mg/dL (ref 8.9–10.3)
CO2: 25 mmol/L (ref 22–32)
CREATININE: 7.91 mg/dL — AB (ref 0.44–1.00)
Chloride: 97 mmol/L — ABNORMAL LOW (ref 101–111)
GFR calc Af Amer: 6 mL/min — ABNORMAL LOW (ref >60)
GFR, EST NON AFRICAN AMERICAN: 5 mL/min — AB (ref >60)
Glucose, Bld: 94 mg/dL (ref 65–99)
PHOSPHORUS: 6.1 mg/dL — AB (ref 2.5–4.6)
Potassium: 4.3 mmol/L (ref 3.5–5.1)
Sodium: 136 mmol/L (ref 135–145)

## 2014-10-27 LAB — CBC
HEMATOCRIT: 31.5 % — AB (ref 36.0–46.0)
HEMOGLOBIN: 10.2 g/dL — AB (ref 12.0–15.0)
MCH: 31.7 pg (ref 26.0–34.0)
MCHC: 32.4 g/dL (ref 30.0–36.0)
MCV: 97.8 fL (ref 78.0–100.0)
Platelets: 180 10*3/uL (ref 150–400)
RBC: 3.22 MIL/uL — AB (ref 3.87–5.11)
RDW: 17.7 % — ABNORMAL HIGH (ref 11.5–15.5)
WBC: 10.5 10*3/uL (ref 4.0–10.5)

## 2014-10-27 MED ORDER — METOPROLOL TARTRATE 100 MG PO TABS: 100.0000 mg | ORAL_TABLET | Freq: Two times a day (BID) | ORAL | Status: AC

## 2014-10-27 MED ORDER — CLONIDINE HCL 0.3 MG PO TABS: 0.3000 mg | ORAL_TABLET | Freq: Two times a day (BID) | ORAL | Status: AC

## 2014-10-27 MED ORDER — FERROUS SULFATE 325 (65 FE) MG PO TABS: 325.0000 mg | ORAL_TABLET | Freq: Two times a day (BID) | ORAL | Status: AC

## 2014-10-27 MED ORDER — DOXERCALCIFEROL 4 MCG/2ML IV SOLN
INTRAVENOUS | Status: AC
Start: 2014-10-27 — End: 2014-10-27
  Administered 2014-10-27: 9 ug via INTRAVENOUS
  Filled 2014-10-27: qty 6

## 2014-10-27 MED ORDER — DOXERCALCIFEROL 4 MCG/2ML IV SOLN: 9.0000 ug | INTRAVENOUS | Status: AC

## 2014-10-27 MED ORDER — HYDRALAZINE HCL 100 MG PO TABS: 100.0000 mg | ORAL_TABLET | Freq: Two times a day (BID) | ORAL | Status: AC

## 2014-10-27 MED ORDER — DOXAZOSIN MESYLATE 2 MG PO TABS: 2.0000 mg | ORAL_TABLET | Freq: Every day | ORAL | Status: AC

## 2014-10-27 NOTE — Progress Notes (Addendum)
[facility] Rounding Note  Subjective:  Had HD this AM Post weight down to 75.7 kg (in hosp gown) Will have new EDW of 76 k for outpt Ready to go home  Objective:    Vital signs in last 24 hours: Filed Vitals:   10/27/14 1000 10/27/14 1030 10/27/14 1100 10/27/14 1114  BP: 177/94 136/71 157/108 138/74  Pulse: 72 74 81 88  Temp:   99.1 F (37.3 C) 99.1 F (37.3 C)  TempSrc:   Oral Oral  Resp: 20 20 18 17   Height:      Weight:   75.7 kg (166 lb 14.2 oz)   SpO2:   98% 97%   Weight change: -4.1 kg (-9 lb 0.6 oz)  Intake/Output Summary (Last 24 hours) at 10/27/14 1220 Last data filed at 10/27/14 1100  Gross per 24 hour  Intake 175.75 ml  Output   1700 ml  Net -1524.25 ml    Physical Exam:  Blood pressure 138/74, pulse 88, temperature 99.1 F (37.3 C), temperature source Oral, resp. rate 17, height 5\' 3"  (1.6 m), weight 75.7 kg (166 lb 14.2 oz), SpO2 97 %. Up ad dressed and ready to leave Right sided TDC with new clean dressing in place No JVD Lungs clear Regular rhythm  soft SEM 2/6 no rub or gallop Abd nondistended +BS Non tender No LE edema  Labs:   Recent Labs Lab 10/23/14 2000 10/24/14 0225 10/25/14 0521 10/26/14 0344 10/27/14 0727  NA 143 142 138 137 136  K 3.8 4.8 4.1 4.5 4.3  CL 102 104 101 100* 97*  CO2 24 25 24 25 25   GLUCOSE 180* 87 92 93 94  BUN 32* 31* 18 14 38*  CREATININE 7.38* 7.94* 6.39* 4.84* 7.91*  CALCIUM 9.0 9.2 8.8* 9.1 9.1  PHOS  --  7.1*  --  5.7* 6.1*     Recent Labs Lab 10/23/14 2000 10/24/14 0225 10/26/14 0344 10/27/14 0727  AST 84* 52*  --   --   ALT 63* 48  --   --   ALKPHOS 113 87  --   --   BILITOT 0.6 0.7  --   --   PROT 6.9 5.9*  --   --   ALBUMIN 3.9 3.2* 3.2* 2.9*    Recent Labs Lab 10/23/14 2000 10/24/14 0225 10/25/14 0521 10/26/14 0344 10/27/14 0726  WBC 20.6* 10.8* 9.8 11.3* 10.5  NEUTROABS 17.0* 9.1*  --   --   --   HGB 12.5 11.2* 9.9* 10.2* 10.2*  HCT 39.6 35.3* 30.8* 32.2* 31.5*   MCV 101.8* 98.6 99.7 100.6* 97.8  PLT 224 192 168 187 180    Recent Labs Lab 10/23/14 2000 10/23/14 2210 10/24/14 0225 10/24/14 1155 10/24/14 1708  TROPONINI 0.09* 0.10* 0.12* 0.14* 0.16*     Recent Labs Lab 10/25/14 0025 10/25/14 0321  GLUCAP 77 101*   Blood cultures (6/18) negative to date     Recent Labs Lab 10/24/14 1230  IRON 25*  TIBC 188*  FERRITIN 618*    Medications . nitroGLYCERIN Stopped (10/26/14 1700)   . amLODipine  10 mg Oral Daily  . aspirin  324 mg Oral Once  . cloNIDine  0.3 mg Oral BID  . doxazosin  2 mg Oral QHS  . doxercalciferol  9 mcg Intravenous Q M,W,F-HD  . ferric gluconate (FERRLECIT/NULECIT) IV  125 mg Intravenous Q Wed-HD  . ferrous sulfate  325 mg Oral BID WC  . heparin  5,000 Units Subcutaneous 3  times per day  . hydrALAZINE  100 mg Oral BID  . metoprolol  100 mg Oral BID    I  have reviewed scheduled and prn medications.  Dialysis prescription: [facility] Unit in [facility] [address] [phone]) MWF F180 3.75 hours 350/800 EDW 83.5 kg (last post wt 75.6) Will have lower EDW at discharge - 76 kg Hectorol 9 mcg TIW 7000 heparin Venofer 50/week Mircera 225 q2weeks - last received 6/13  Background: 64 y.o. year-old with hx of HTN, ESRD in MWF HD at [facility] in [facility]  who was admitted with severe hypertension,  respiratory distress requiring intubation, AMS. CXR initially showed bilat severe pulm edema. We were called to provide urgent dialysis for pulmonary edema.   ASSESSMENT/RECOMMENDATIONS  1. Acute hypoxemic resp failure / flash pulm edema - in association with severe HTN crisis. S/p HD X 3 with reduction in weight of close to 6.5 kg. She will have a lower EDW at discharge. Malignant HTN + volume overload responsible for APE which is resolved. No O2 requirement now.  2. VDRF - resolved. 3. ESRD on HD in [place] MWF. Next HD 6/24 at her home unit 4. Fever - Low grade 99 now. No AVF, uses catheter. Blood  cultures X 2 negative to date and leukocytosis improved.  Not on ATB's  5. HTN - BP's better but still high (she says "high all my life" In the meantime, increased hydralazine to 100 mg BID. Same clonidine, metoprolol, clonidine, doxazosin. BP improved 6. Anemia - Rec'd Mircera 225 mcg on 6/13. Not due for redosing. Hb just over 10 - goal. 7. Hx breast cancer s/p rad rx and chemo. On letrozole 8. Hx "CHF" unknown type. EF 65-70% by ECHO 6/19. + trop leak likely demand ischemia from malignant htn. No chest pain 9. Disposition - OK from nephrology standpoint for discharge to home. Next HD Friday at her unit. I will call them with her new EDW and FAX records   Jamal Maes, MD [place] [phone] Pager 10/27/2014, 12:20 PM

## 2014-10-27 NOTE — Progress Notes (Signed)
Examined on dialysis Able to lie supine, lungs clear Blood pressure remains borderline high.  Back on amlodipine, doxazosin, hydralazine, metoprolol, clonidine increased to 0.3 twice a day. Hypertension has been a lifelong problem for her.  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  Okay for discharge today-she has outpatient dialysis set up  Rigoberto Noel. MD

## 2014-10-27 NOTE — Progress Notes (Signed)
Pt alert, vss, completed HD with no complications. Report given to primary RN. Patient returned safely to room.

## 2014-10-27 NOTE — Discharge Summary (Signed)
Physician Discharge Summary  Patient ID: Courtney Bullock MRN: [id number] DOB/AGE: Jul 29, 1950 64 y.o.  Admit date: 10/23/2014 Discharge date: 10/27/2014  Problem List Active Problems:   Acute respiratory failure with hypoxia   Acute pulmonary edema   Acute respiratory failure with hypoxemia  HPI: Patient is a 64 year old female with a past medical history significant for end-stage renal disease on hemodialysis (she does make urine, though), CHF unknown type, breast cancer cancer. Per report, the patient's husband at the outside ED states that the patient has not missed any dialysis appointments. Her last session was 1 day ago.  Per report, patient was picked up by EMS where she was wearing her home CPAP and was placed on BiPAP at the emergency department but then was noted to have a decreased level of consciousness and then became nonverbal. She began to arouse after about 10 minutes at the emergency department but then became nauseous which led to her being taken off of BiPAP. Her work of breathing increased and the decision was made to intubate the patient. It is noted in the respiratory therapist note that she had a moderate amount of pink, thick, frothy secretions which were suctioned from the endotracheal tube.  Upon arrival to our ICU the patient's husband and daughter were at bedside and they agreed with the history provided from the other facility stating that she was in her normal state of health and they were getting ready to go to dinner when the patient had an acute onset of shortness of breath upon standing from the couch. Family states that she was at her baseline state of health prior to this  Hospital Course:    ASSESSMENT / PLAN:  PULMONARY OETT 10/23/2014 >> 6/20 A: Acute hypoxemic respiratory failure secondary to acute pulmonary edema P:  Tolerated extubation, now on RA Repeat CXR am  Need tight BP control and note new dry weight based on Renal eval this  admission   CARDIOVASCULAR A: Hypertensive emergency with end organ damage, flash pulmonary edema History of heart failure unknown type, acute exacerbation Elevated troponin of 0.12, likely secondary to hypertensive emergency  P:  Back on amlodipine, doxazosin, hydralazine, metoprolol Change clonidine to 0.3mg  BID, ? Whether this was ordered this way to avoid lows during her HD sessions Titrate nitro gtt off 6/21 See MAR for all medication changes.   RENAL A: End-stage renal disease on hemodialysis M/W/F  S/p HD 10/24/14 and repeat HD 10/25/14 and 6/22,   P:  Dry wt will likely be ~6kg lower than her prior, appreciate Dr Sanda Klein assistance.  She is to follow up with [facility] HD center  GASTROINTESTINAL A: Slight transaminitis with increased AST of 52 possibly secondary to congestive hepatopathy, improved P: GI prophylaxis with once a day famotidine   HEMATOLOGIC/oncologic A: History of breast cancer now in remission for the last 2 years Normocytic anemia, likely of chronic disease H/o PICA -eating sand Fe deficiency  P: Start FeSO4 in addition to her Fe that she receives at HD   INFECTIOUS A: Patient does not appear to have a source of infection at this time. She did receive broad-spectrum antibiotics empirically in the emergency department of outside hospital. The leukocytosis noted at outside hospitals now resolved   P:  Follow up blood cultures taken at outside hospital Vanc 10/23/14 >>10/23/14 Zosyn 10/23/14 >10/23/14  ENDOCRINE A: No active issues  P:   NEUROLOGIC A: No active issues,     Labs at discharge Lab Results  Component Value Date  CREATININE 7.91* 10/27/2014   BUN 38* 10/27/2014   NA 136 10/27/2014   K 4.3 10/27/2014   CL 97* 10/27/2014   CO2 25 10/27/2014   Lab Results  Component Value Date   WBC 10.5 10/27/2014   HGB 10.2* 10/27/2014   HCT 31.5* 10/27/2014   MCV 97.8 10/27/2014   PLT 180 10/27/2014   Lab Results   Component Value Date   ALT 48 10/24/2014   AST 52* 10/24/2014   ALKPHOS 87 10/24/2014   BILITOT 0.7 10/24/2014   Lab Results  Component Value Date   INR 1.03 10/23/2014    Current radiology studies No results found.  Disposition:  Final discharge disposition not confirmed  Discharge Instructions    Discharge patient    Complete by:  As directed             Medication List    STOP taking these medications        ramipril 10 MG capsule  Commonly known as:  ALTACE      TAKE these medications        amLODipine 5 MG tablet  Commonly known as:  NORVASC  Take 5 mg by mouth 2 (two) times daily.     aspirin 325 MG tablet  Take 325 mg by mouth daily.     cholecalciferol 1000 UNITS tablet  Commonly known as:  VITAMIN D  Take 1,000 Units by mouth daily.     cloNIDine 0.3 MG tablet  Commonly known as:  CATAPRES  Take 1 tablet (0.3 mg total) by mouth 2 (two) times daily.     doxazosin 2 MG tablet  Commonly known as:  CARDURA  Take 1 tablet (2 mg total) by mouth at bedtime.     febuxostat 40 MG tablet  Commonly known as:  ULORIC  Take 40 mg by mouth daily.     ferrous sulfate 325 (65 FE) MG tablet  Take 1 tablet (325 mg total) by mouth 2 (two) times daily with a meal.     hydrALAZINE 100 MG tablet  Commonly known as:  APRESOLINE  Take 1 tablet (100 mg total) by mouth 2 (two) times daily.     letrozole 2.5 MG tablet  Commonly known as:  FEMARA  Take 2.5 mg by mouth daily.     metoprolol 100 MG tablet  Commonly known as:  LOPRESSOR  Take 1 tablet (100 mg total) by mouth 2 (two) times daily.     sevelamer carbonate 800 MG tablet  Commonly known as:  RENVELA  Take 800 mg by mouth daily before supper.     torsemide 20 MG tablet  Commonly known as:  DEMADEX  Take 20 mg by mouth daily.     vitamin C 1000 MG tablet  Take 1,000 mg by mouth daily.     zinc gluconate 50 MG tablet  Take 50 mg by mouth daily.          Discharged Condition: fair  Time  spent on discharge greater than 40 minutes.  Vital signs at Discharge. Temp:  [97.5 F (36.4 C)-99.5 F (37.5 C)] 99.1 F (37.3 C) (06/22 1114) Pulse Rate:  [69-88] 88 (06/22 1114) Resp:  [16-20] 17 (06/22 1114) BP: (124-189)/(53-108) 138/74 mmHg (06/22 1114) SpO2:  [97 %-100 %] 97 % (06/22 1114) Weight:  [166 lb 14.2 oz (75.7 kg)-171 lb 4.8 oz (77.7 kg)] 171 lb 4.8 oz (77.7 kg) (06/22 0700) Office follow up Special Information or instructions.  She is to  follow up with her Primary Care MD. Dr.  Junious Silk in [place]. [phone]. Message left at office. She has HD set up in [facility] and in [place].  Signed: Richardson Landry Minor ACNP Maryanna Shape PCCM Pager [phone] till 3 pm If no answer page [phone] 10/27/2014, 11:42 AM

## 2014-10-29 LAB — CULTURE, BLOOD (ROUTINE X 2)
CULTURE: NO GROWTH
Culture: NO GROWTH

## 2015-11-02 IMAGING — DX DG CHEST 1V PORT
1 series · 1 of 1 positions shown · non-contrast
Comparison: None.

CLINICAL DATA: Hypoxia.  Chronic renal failure

EXAM:
PORTABLE CHEST - 1 VIEW

[chest ap]
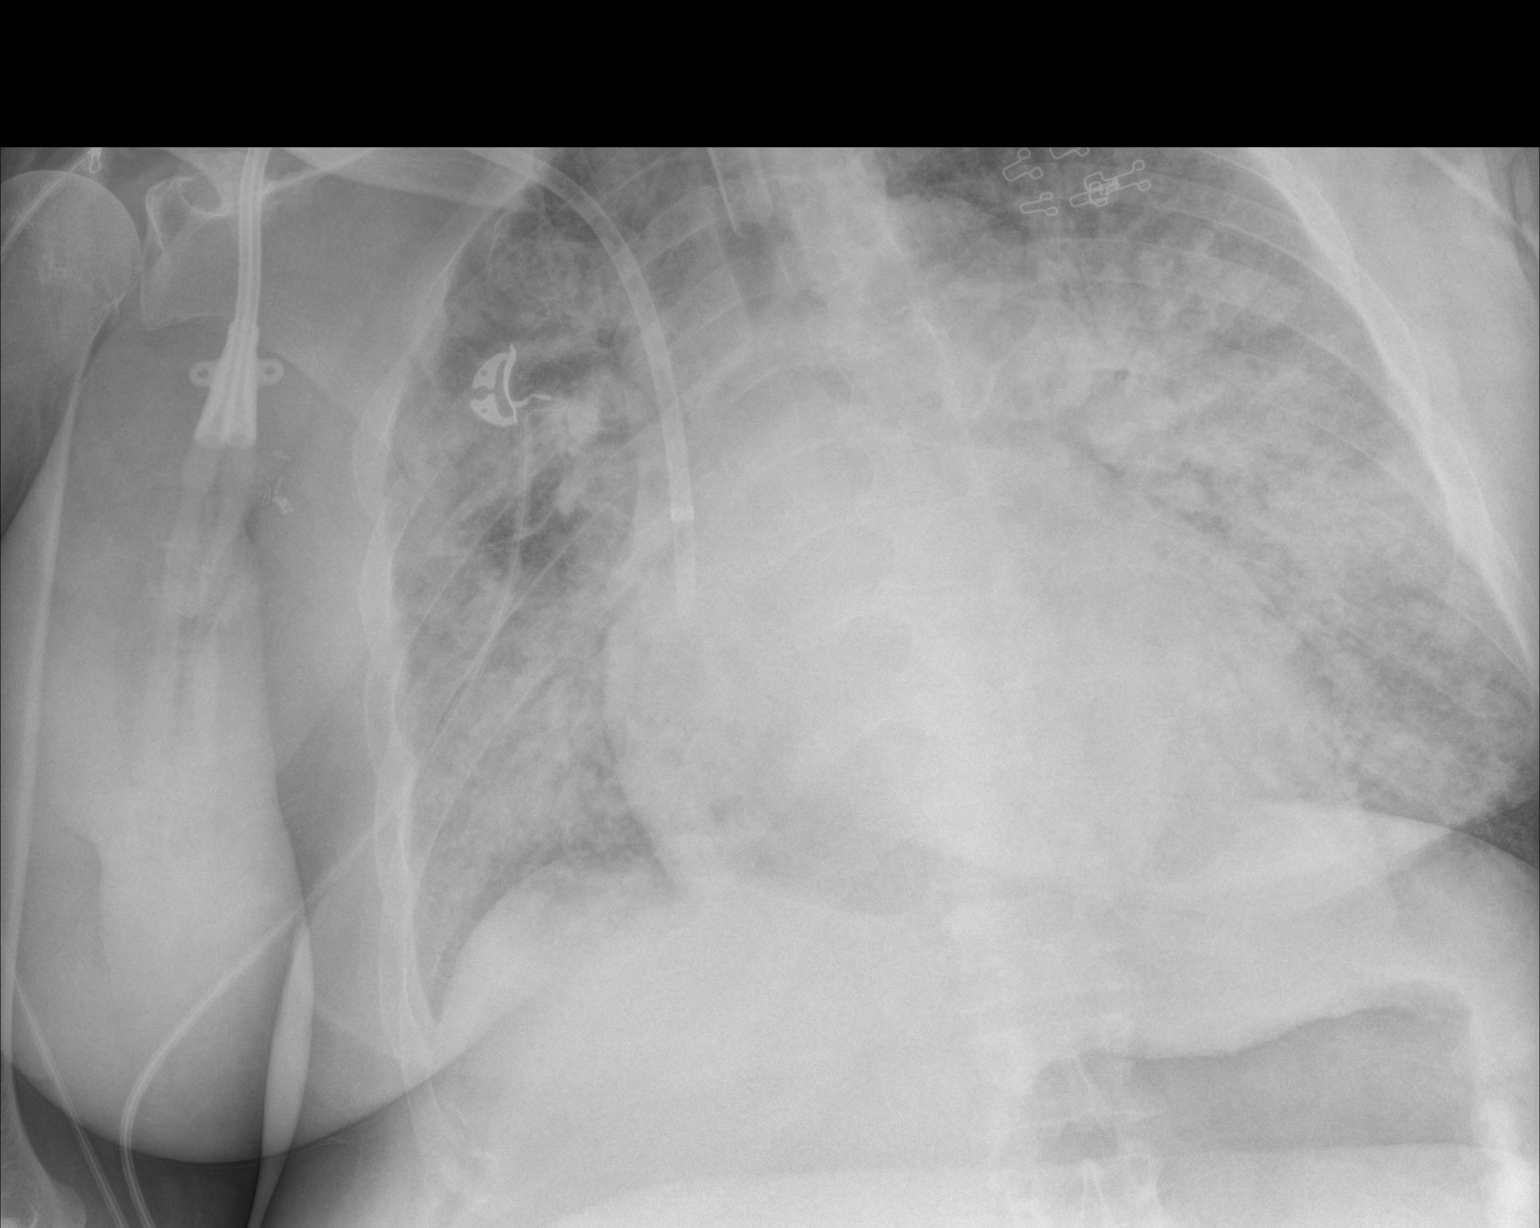

[1 of 1 positions shown; findings below may reference images not displayed]

FINDINGS: Endotracheal tube tip is 4.2 cm above the carina. Central catheter
tip is in the superior vena cava. No pneumothorax. There is
extensive interstitial and alveolar edema diffusely. Heart is
enlarged with pulmonary venous hypertension. No apparent adenopathy.
IMPRESSION: Tube and catheter positions as described without pneumothorax.
Evidence of congestive heart failure with extensive edema as well as
cardiomegaly.

## 2015-11-03 IMAGING — CR DG CHEST 1V PORT
1 series · 1 of 1 positions shown · non-contrast
Comparison: 10/23/2014

CLINICAL DATA: Pulmonary edema

EXAM:
PORTABLE CHEST - 1 VIEW

[AP]
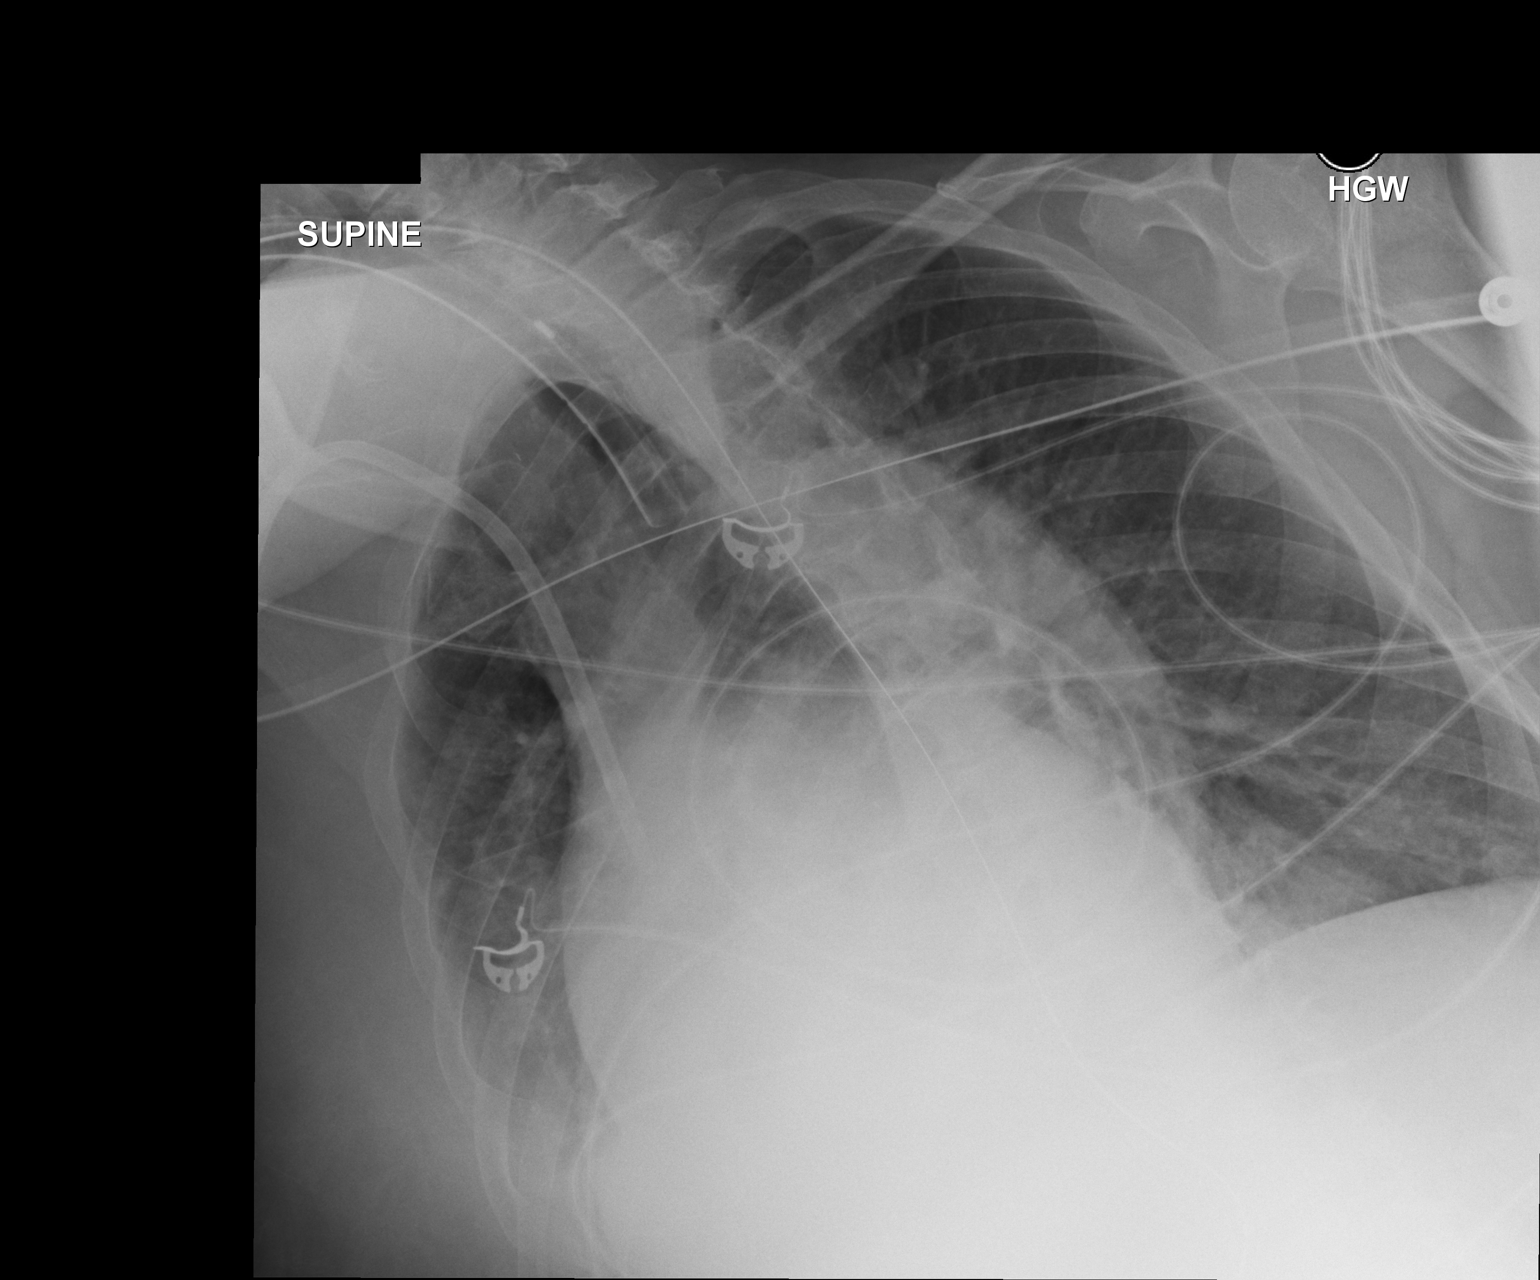

[1 of 1 positions shown; findings below may reference images not displayed]

FINDINGS: Endotracheal tube in good position. Right jugular catheter tip in
the SVC unchanged. No pneumothorax

Significant improvement in diffuse bilateral edema. Small right
pleural effusion has developed since the prior study. Increase in
bibasilar atelectasis

NG tube in the stomach
IMPRESSION: Endotracheal tube remains in good position.

Marked improvement in pulmonary edema. Mild edema and small right
pleural effusion remain. Increase in bibasilar atelectasis.

## 2015-11-04 IMAGING — CR DG CHEST 1V PORT
1 series · 1 of 1 positions shown · non-contrast
Comparison: 10/24/2014

CLINICAL DATA: Acute respiratory failure.

EXAM:
PORTABLE CHEST - 1 VIEW

[AP]
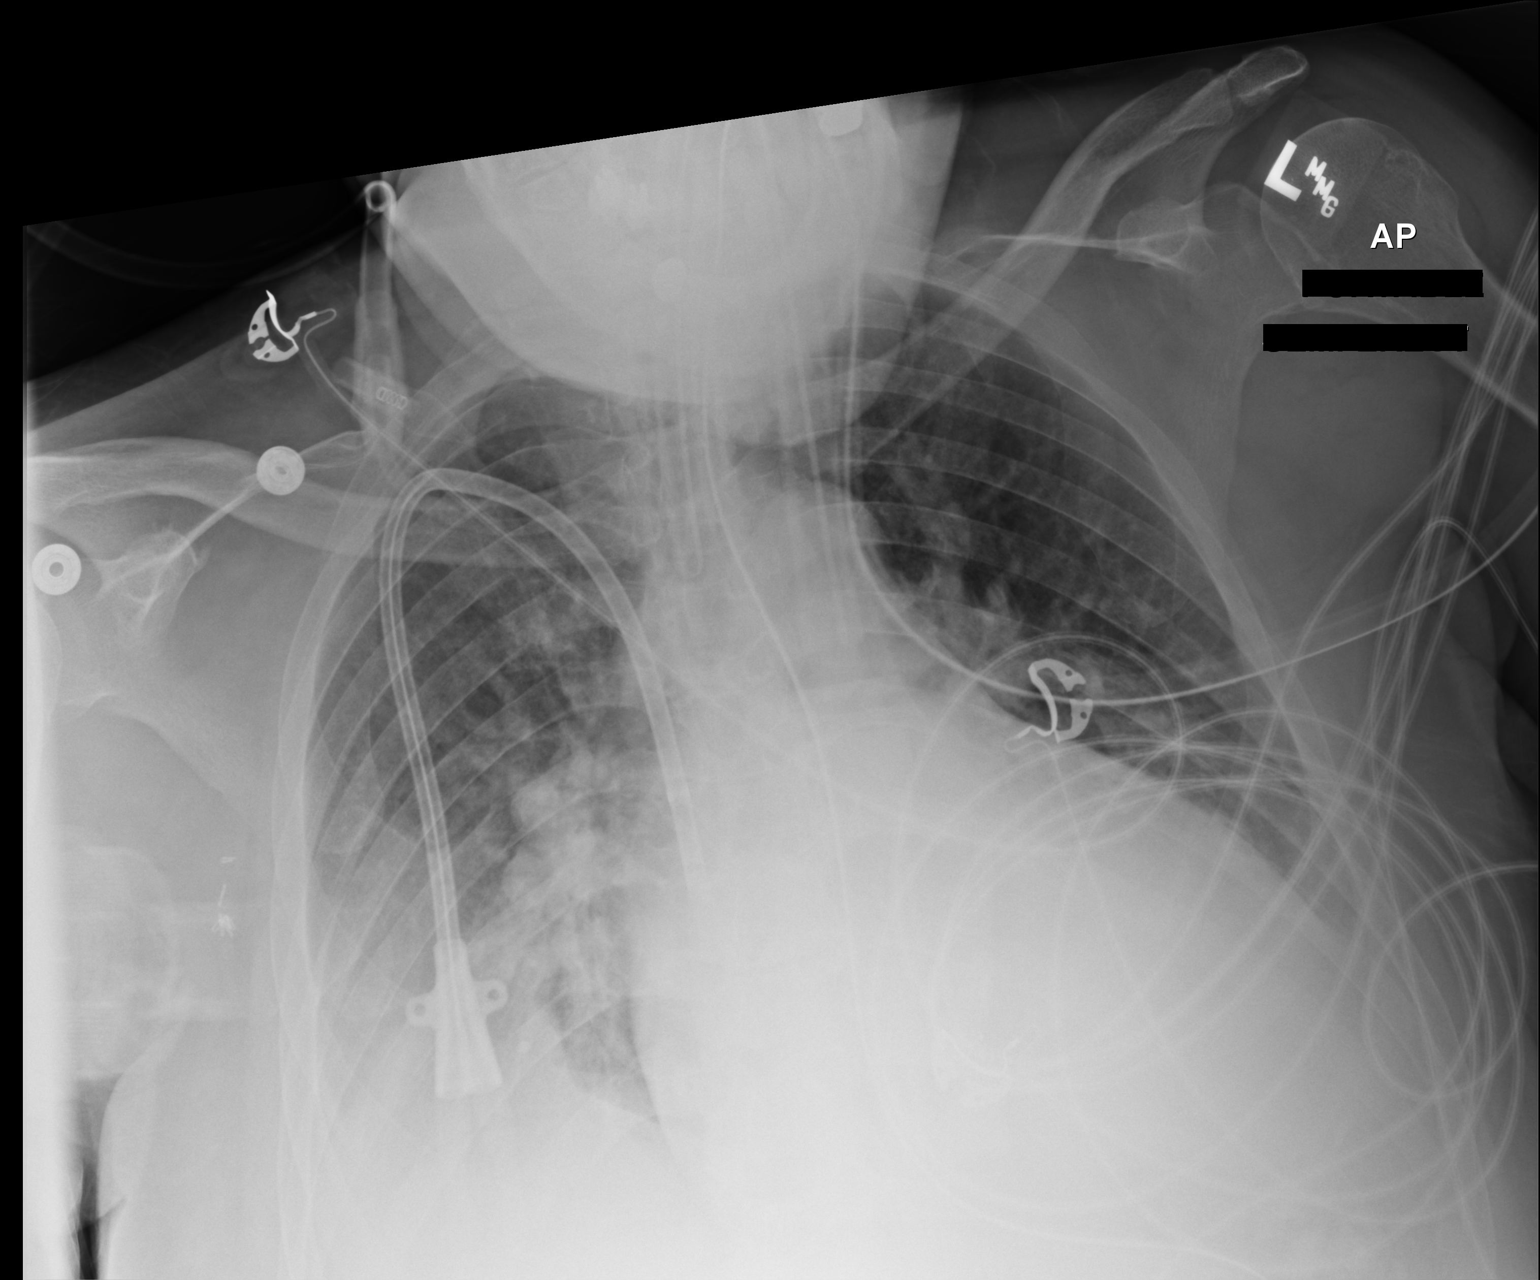

[1 of 1 positions shown; findings below may reference images not displayed]

FINDINGS: Endotracheal tube terminates approximately 2.5 cm above the carina.
Right jugular central venous catheter terminates new the cavoatrial
junction. Enteric tube courses into the left upper abdomen with tip
not imaged. Cardiac silhouette is enlarged. There is a small veiling
right pleural effusion. There is likely also a small left pleural
effusion. Mild pulmonary edema persists, likely with superimposed
atelectasis in both lung bases. No pneumothorax is identified.
IMPRESSION: Persistent mild pulmonary edema with small pleural effusions.

## 2021-03-07 DEATH — deceased
